# Patient Record
Sex: Male | Born: 1950 | Race: White | Hispanic: No | Marital: Married | Smoking: Never smoker
Health system: Southern US, Community
[De-identification: ages and names within clinical notes are randomized; demographics above are authoritative.]

## PROBLEM LIST (undated history)

## (undated) DIAGNOSIS — I10 Essential (primary) hypertension: Secondary | ICD-10-CM

## (undated) DIAGNOSIS — R51 Headache: Secondary | ICD-10-CM

## (undated) DIAGNOSIS — R609 Edema, unspecified: Secondary | ICD-10-CM

## (undated) DIAGNOSIS — G4733 Obstructive sleep apnea (adult) (pediatric): Secondary | ICD-10-CM

## (undated) DIAGNOSIS — N189 Chronic kidney disease, unspecified: Secondary | ICD-10-CM

## (undated) DIAGNOSIS — E7800 Pure hypercholesterolemia, unspecified: Secondary | ICD-10-CM

## (undated) DIAGNOSIS — M19011 Primary osteoarthritis, right shoulder: Secondary | ICD-10-CM

## (undated) DIAGNOSIS — R600 Localized edema: Secondary | ICD-10-CM

## (undated) DIAGNOSIS — M79609 Pain in unspecified limb: Secondary | ICD-10-CM

## (undated) DIAGNOSIS — R0683 Snoring: Secondary | ICD-10-CM

## (undated) DIAGNOSIS — T7840XA Allergy, unspecified, initial encounter: Secondary | ICD-10-CM

## (undated) DIAGNOSIS — L409 Psoriasis, unspecified: Secondary | ICD-10-CM

## (undated) DIAGNOSIS — G4700 Insomnia, unspecified: Secondary | ICD-10-CM

## (undated) DIAGNOSIS — M2550 Pain in unspecified joint: Secondary | ICD-10-CM

## (undated) DIAGNOSIS — M1990 Unspecified osteoarthritis, unspecified site: Secondary | ICD-10-CM

## (undated) DIAGNOSIS — K649 Unspecified hemorrhoids: Secondary | ICD-10-CM

## (undated) DIAGNOSIS — K589 Irritable bowel syndrome without diarrhea: Secondary | ICD-10-CM

## (undated) DIAGNOSIS — K5900 Constipation, unspecified: Secondary | ICD-10-CM

## (undated) DIAGNOSIS — J189 Pneumonia, unspecified organism: Secondary | ICD-10-CM

## (undated) DIAGNOSIS — Z8739 Personal history of other diseases of the musculoskeletal system and connective tissue: Secondary | ICD-10-CM

## (undated) DIAGNOSIS — E785 Hyperlipidemia, unspecified: Secondary | ICD-10-CM

## (undated) DIAGNOSIS — G4730 Sleep apnea, unspecified: Secondary | ICD-10-CM

## (undated) DIAGNOSIS — E119 Type 2 diabetes mellitus without complications: Secondary | ICD-10-CM

## (undated) DIAGNOSIS — K219 Gastro-esophageal reflux disease without esophagitis: Secondary | ICD-10-CM

## (undated) DIAGNOSIS — R0602 Shortness of breath: Secondary | ICD-10-CM

## (undated) HISTORY — DX: Morbid (severe) obesity due to excess calories: E66.01

## (undated) HISTORY — PX: ESOPHAGOGASTRODUODENOSCOPY: SHX1529

## (undated) HISTORY — PX: ANKLE SURGERY: SHX546

## (undated) HISTORY — DX: Sleep apnea, unspecified: G47.30

## (undated) HISTORY — DX: Snoring: R06.83

## (undated) HISTORY — PX: COLONOSCOPY: SHX174

## (undated) HISTORY — DX: Irritable bowel syndrome, unspecified: K58.9

## (undated) HISTORY — DX: Type 2 diabetes mellitus without complications: E11.9

## (undated) HISTORY — DX: Allergy, unspecified, initial encounter: T78.40XA

## (undated) HISTORY — DX: Chronic kidney disease, unspecified: N18.9

## (undated) HISTORY — PX: KNEE SURGERY: SHX244

## (undated) HISTORY — DX: Pure hypercholesterolemia, unspecified: E78.00

## (undated) HISTORY — DX: Obstructive sleep apnea (adult) (pediatric): G47.33

## (undated) HISTORY — DX: Pain in unspecified limb: M79.609

## (undated) HISTORY — DX: Essential (primary) hypertension: I10

---

## 2000-03-08 ENCOUNTER — Ambulatory Visit (HOSPITAL_BASED_OUTPATIENT_CLINIC_OR_DEPARTMENT_OTHER): Admission: RE | Admit: 2000-03-08 | Discharge: 2000-03-08 | Payer: Self-pay | Admitting: *Deleted

## 2000-03-30 ENCOUNTER — Encounter: Admission: RE | Admit: 2000-03-30 | Discharge: 2000-06-28 | Payer: Self-pay | Admitting: Internal Medicine

## 2000-07-07 ENCOUNTER — Encounter: Admission: RE | Admit: 2000-07-07 | Discharge: 2000-07-07 | Payer: Self-pay | Admitting: *Deleted

## 2005-11-18 ENCOUNTER — Ambulatory Visit: Payer: Self-pay | Admitting: Gastroenterology

## 2005-12-07 ENCOUNTER — Encounter: Payer: Self-pay | Admitting: Gastroenterology

## 2005-12-07 ENCOUNTER — Ambulatory Visit: Payer: Self-pay | Admitting: Gastroenterology

## 2008-07-05 ENCOUNTER — Encounter: Admission: RE | Admit: 2008-07-05 | Discharge: 2008-07-05 | Payer: Self-pay | Admitting: Sports Medicine

## 2010-08-28 NOTE — Consult Note (Signed)
[facility]  Patient:    Jamie Figueroa, Jamie Figueroa                       MRN: [id number] Proc. Date: 03/30/00 Adm. Date:  [id number] Attending:  Kandis Mannan                          Consultation Report  CHIEF COMPLAINT:  Painful area, plantar surface, right foot.  PRESENT ILLNESS:  This 60 year old white male, type 2 diabetic, has had pain on the ball of his right foot medially for about two months.  He does not remember stepping on anything and does not think that a foreign body is in his foot.  The area was probed with a needle by Dr. Tera Mater. [place] but no foreign body was noted.  He has not had any x-rays of the area.  Patient also has a slight callus of the right foot medially on the plantar surface.  MEDICATIONS  1. Hydrochlorothiazide.  2. Aspirin one a day.  3. Lasix 80 mg a day.  4. Altace 10 mg a day.  5. Tricor 200 mg a day.  6. Avandia 8 mg daily.  7. Celebrex 200 mg daily.  8. Vioxx 25 mg daily.  9. Diazepam 5 mg daily. 10. Ibuprofen 800 mg three times a day.  OPERATIONS:  Right knee surgery and also left knee surgery.  PHYSICAL EXAMINATION  EXTREMITIES:  Examination today reveals an obese white male who has a slightly firm area over the ball of his right foot on the plantar surface.  This was pared down and there appeared to be a 1-mm type plug which may represent a plantar wart.  There is certainly no foreign body seen or felt.  The callus of the medial aspect of the right foot was also pared.  DISPOSITION:  I do not think an x-ray is indicated because I think the risk of a foreign body is so low.  I think he does have a small plantar wart and he was advised to use a Band-Aid over the area.  I do not recommend excision.  We will see back in the clinic on a p.r.n. basis. DD:  03/30/00 TD:  03/31/00 Job: [id number]

## 2010-11-25 ENCOUNTER — Encounter: Payer: Self-pay | Admitting: Gastroenterology

## 2011-02-19 ENCOUNTER — Encounter: Payer: Self-pay | Admitting: Gastroenterology

## 2011-03-30 ENCOUNTER — Other Ambulatory Visit: Payer: Self-pay | Admitting: Gastroenterology

## 2012-01-10 ENCOUNTER — Encounter: Payer: Self-pay | Admitting: Gastroenterology

## 2012-07-03 ENCOUNTER — Ambulatory Visit (HOSPITAL_COMMUNITY): Payer: BC Managed Care – PPO | Attending: Cardiovascular Disease | Admitting: Radiology

## 2012-07-03 VITALS — BP 127/73 | Ht 70.0 in | Wt 286.0 lb

## 2012-07-03 DIAGNOSIS — R0602 Shortness of breath: Secondary | ICD-10-CM | POA: Insufficient documentation

## 2012-07-03 DIAGNOSIS — I4949 Other premature depolarization: Secondary | ICD-10-CM

## 2012-07-03 MED ORDER — REGADENOSON 0.4 MG/5ML IV SOLN
0.4000 mg | Freq: Once | INTRAVENOUS | Status: AC
  Administered 2012-07-03: 0.4 mg via INTRAVENOUS

## 2012-07-03 MED ORDER — TECHNETIUM TC 99M SESTAMIBI GENERIC - CARDIOLITE
30.0000 | Freq: Once | INTRAVENOUS | Status: AC | PRN
  Administered 2012-07-03: 30 via INTRAVENOUS

## 2012-07-03 NOTE — Progress Notes (Signed)
[facility] SITE 3 NUCLEAR MED [address] [place], [place] [address] [phone]    Cardiology Nuclear Med Study  Jamie Figueroa is a 62 y.o. male     MRN : [id number]     DOB: 1951/03/19  Procedure Date: 07/03/2012  Nuclear Med Background Indication for Stress Test:  Evaluation for Ischemia and Abnormal EKG History:  greater than 5 yrs MPS: per dictation @ [place] Cardio stress test No report Cardiac Risk Factors: Family History - CAD, Hypertension, IDDM Type 2 and Lipids  Symptoms:  Fatigue, Palpitations and SOB   Nuclear Pre-Procedure Caffeine/Decaff Intake:  None NPO After: 7:00pm   Lungs:  clear O2 Sat: 95% on room air. IV 0.9% NS with Angio Cath:  22g  IV Site: L Wrist  IV Started by:  Stanton Kidney, EMT-P  Chest Size (in):  50 Cup Size: n/a  Height: 5\' 10"  (1.778 m)  Weight:  286 lb (129.729 kg)  BMI:  Body mass index is 41.04 kg/(m^2). Tech Comments:  CBG= 115 @ 6:30am, per patient.    Nuclear Med Study 1 or 2 day study: 2 day  Stress Test Type:  Eugenie Birks  Reading MD: Charlton Haws, MD  Order Authorizing Provider:  Tis[place]c  Resting Radionuclide: Technetium 75m Sestamibi  Resting Radionuclide Dose: 33.0 mCi  07/04/12  Stress Radionuclide:  Technetium 8m Sestamibi  Stress Radionuclide Dose: 33.0 mCi  07/03/12          Stress Protocol Rest HR: 62 Stress HR: 88  Rest BP: 127/73 Stress BP: 160/70  Exercise Time (min): n/a METS: n/a   Predicted Max HR: 157 bpm % Max HR: 56.05 bpm Rate Pressure Product: 21308   Dose of Adenosine (mg):  n/a Dose of Lexiscan: 0.4 mg  Dose of Atropine (mg): n/a Dose of Dobutamine: n/a mcg/kg/min (at max HR)  Stress Test Technologist: Milana Na, EMT-P  Nuclear Technologist:  Doyne Keel, CNMT     Rest Procedure:  Myocardial perfusion imaging was performed at rest 45 minutes following the intravenous administration of Technetium 52m Sestamibi. Rest ECG: NSR with non-specific ST-T wave changes  Stress Procedure:  The  patient received IV Lexiscan 0.4 mg over 15-seconds.  Technetium 20m Sestamibi injected at 30-seconds. This patient was sob, had a cough and felt weird with the Lexiscan infusion. Quantitative spect images were obtained after a 45 minute delay. Stress ECG: No significant change from baseline ECG  QPS Raw Data Images:  Normal; no motion artifact; normal heart/lung ratio. Stress Images:  There is mild apical thinning with normal uptake in other regions. Rest Images:  There is mild apical thinning with normal uptake in other regions. Subtraction (SDS):  No evidence of ischemia. Transient Ischemic Dilatation (Normal <1.22):  0.98 Lung/Heart Ratio (Normal <0.45):  0.35  Quantitative Gated Spect Images QGS EDV:  84 ml QGS ESV:  32 ml  Impression Exercise Capacity:  Lexiscan with no exercise. BP Response:  Normal blood pressure response. Clinical Symptoms:  No significant symptoms noted. ECG Impression:  No significant ST segment change suggestive of ischemia. Comparison with Prior Nuclear Study: No images to compare  Overall Impression:  Low risk stress nuclear study.   There is no evidence of ischemia.  The apical thinning is likely due to artifact.   LV Ejection Fraction: 63%.  LV Wall Motion:  NL LV Function; NL Wall Motion.   [name], [name]., MD, [facility] 07/04/2012, 5:35 PM Office - [phone] Pager [phone]

## 2012-07-04 ENCOUNTER — Ambulatory Visit (HOSPITAL_COMMUNITY): Payer: BC Managed Care – PPO | Attending: Cardiovascular Disease | Admitting: Radiology

## 2012-07-04 DIAGNOSIS — R0989 Other specified symptoms and signs involving the circulatory and respiratory systems: Secondary | ICD-10-CM

## 2012-07-04 MED ORDER — TECHNETIUM TC 99M SESTAMIBI GENERIC - CARDIOLITE
33.0000 | Freq: Once | INTRAVENOUS | Status: AC | PRN
  Administered 2012-07-04: 33 via INTRAVENOUS

## 2012-07-05 NOTE — Addendum Note (Signed)
Addended by: Domenic Polite on: 07/05/2012 10:12 AM   Modules accepted: Orders

## 2012-11-24 ENCOUNTER — Ambulatory Visit
Admission: RE | Admit: 2012-11-24 | Discharge: 2012-11-24 | Disposition: A | Discharge status: Home / Self Care | Payer: BC Managed Care – PPO | Source: Ambulatory Visit | Attending: Orthopedic Surgery | Admitting: Orthopedic Surgery

## 2012-11-24 ENCOUNTER — Other Ambulatory Visit: Payer: Self-pay | Admitting: Orthopedic Surgery

## 2012-11-24 DIAGNOSIS — M25511 Pain in right shoulder: Secondary | ICD-10-CM

## 2012-11-27 ENCOUNTER — Other Ambulatory Visit: Payer: Self-pay | Admitting: Orthopedic Surgery

## 2012-11-27 ENCOUNTER — Ambulatory Visit
Admission: RE | Admit: 2012-11-27 | Discharge: 2012-11-27 | Disposition: A | Discharge status: Home / Self Care | Payer: BC Managed Care – PPO | Source: Ambulatory Visit | Attending: Orthopedic Surgery | Admitting: Orthopedic Surgery

## 2012-11-27 DIAGNOSIS — M25511 Pain in right shoulder: Secondary | ICD-10-CM

## 2012-12-07 ENCOUNTER — Encounter: Payer: Self-pay | Admitting: Neurology

## 2012-12-07 ENCOUNTER — Ambulatory Visit (INDEPENDENT_AMBULATORY_CARE_PROVIDER_SITE_OTHER): Payer: BC Managed Care – PPO | Admitting: Neurology

## 2012-12-07 VITALS — BP 143/76 | HR 68 | Temp 98.3°F | Ht 70.0 in | Wt 286.0 lb

## 2012-12-07 DIAGNOSIS — R400 Somnolence: Secondary | ICD-10-CM

## 2012-12-07 DIAGNOSIS — M19019 Primary osteoarthritis, unspecified shoulder: Secondary | ICD-10-CM

## 2012-12-07 DIAGNOSIS — R0683 Snoring: Secondary | ICD-10-CM

## 2012-12-07 DIAGNOSIS — G4710 Hypersomnia, unspecified: Secondary | ICD-10-CM

## 2012-12-07 DIAGNOSIS — R0609 Other forms of dyspnea: Secondary | ICD-10-CM

## 2012-12-07 DIAGNOSIS — G4733 Obstructive sleep apnea (adult) (pediatric): Secondary | ICD-10-CM

## 2012-12-07 DIAGNOSIS — R413 Other amnesia: Secondary | ICD-10-CM

## 2012-12-07 HISTORY — DX: Snoring: R06.83

## 2012-12-07 NOTE — Patient Instructions (Addendum)
Based on your symptoms and your exam I believe you are at risk for obstructive sleep apnea or OSA, and I think we should proceed with a sleep study to determine whether you do or do not have OSA and how severe it is. If you have more than mild OSA, I want you to consider treatment with CPAP. Please remember, the risks and ramifications of moderate to severe obstructive sleep apnea or OSA are: Cardiovascular disease, including congestive heart failure, stroke, difficult to control hypertension, arrhythmias, and even type 2 diabetes has been linked to untreated OSA. Sleep apnea causes disruption of sleep and sleep deprivation in most cases, which, in turn, can cause recurrent headaches, problems with memory, mood, concentration, focus, and vigilance. Most people with untreated sleep apnea report excessive daytime sleepiness, which can affect their ability to drive. Please do not drive if you feel sleepy.  I will see you back after your sleep study to go over the test results and where to go from there. We will call you after your sleep study and to set up an appointment at the time.   Please remember to try to maintain good sleep hygiene, which means: Keep a regular sleep and wake schedule, try not to exercise or have a meal within 2 hours of your bedtime, try to keep your bedroom conducive for sleep, that is, cool and dark, without light distractors such as an illuminated alarm clock, and refrain from watching TV right before sleep or in the middle of the night and do not keep the TV or radio on during the night. Also, try not to use or play on electronic devices at bedtime, such as your cell phone, tablet PC or laptop. If you like to read at bedtime on an electronic device, try to dim the background light as much as possible.  

## 2012-12-07 NOTE — Progress Notes (Signed)
Subjective:    Patient ID: Jamie Figueroa is a 62 y.o. male.  HPI  Huston Foley, MD, PhD [facility] [address] [address] [place], [place] [address]  Dear Dr. Wylene Simmer,   I saw your patient, Jamie Figueroa, upon your kind request in my neurologic clinic today for initial consultation of his sleep disorder, concern for obstructive sleep apnea. The patient is accompanied by his wife today. As you know, Jamie Figueroa is a very pleasant 62 year old right-handed gentleman with an underlying medical history of hypertension, diabetes, hyperlipidemia, irritable bowel syndrome, chronic kidney disease and obesity, who reports snoring, excessive daytime somnolence, nonrestorative sleep, and disrupted sleep.   His typical bedtime is reported to be around 8:30 to 10 PM and usual wake time is around 5:30-6 AM. Sleep onset typically occurs within a few minutes. He reports feeling marginally rested upon awakening. He wakes up on an average 3 to 4 times in the middle of the night and has to go to the bathroom 1 time on a typical night. He admits to rare morning headaches.  He reports excessive daytime somnolence (EDS) and His Epworth Sleepiness Score (ESS) is 12/24 today. He has fallen asleep while driving as in close to dozing off. The patient has not been taking a planned nap, but falls asleep sometimes without planning to.   He has been known to snore for the past many years. Snoring is reportedly moderate, and associated with choking sounds and witnessed apneas. The patient denies a sense of choking or strangling feeling. There is report of rare nighttime reflux, with rare nighttime cough experienced. The patient has not noted any RLS symptoms and is not known to kick while asleep or before falling asleep. There is no family history of RLS or OSA, but his father snored.  He is a restless sleeper and in the morning, the bed is quite disheveled.   He denies cataplexy, hypnagogic or  hypnopompic hallucinations, or sleep attacks, except for rare, remote sleep paralysis. He does not report any vivid dreams, nightmares, dream enactments, or parasomnias, such as sleep talking or sleep walking. The patient has not had a sleep study or a home sleep test.  He consumes 3 caffeinated beverages per day, usually in the form of sodas, and tea.  His bedroom is usually dark and cool. There is a TV in the bedroom and usually it is not on at night or turned off right before sleep onset.  He reports some memory issues in the past year, mostly forgetfulness and word finding difficulties, misplacing things and losing train of thought.   His Past Medical History Is Significant For: Past Medical History  Diagnosis Date  . HTN (hypertension)   . DM (diabetes mellitus)   . Hypercholesteremia   . Morbid obesity   . Irritable bowel syndrome   . Chronic kidney disease, stage III (moderate)   . Pain in limb   . Obstructive sleep apnea (adult) (pediatric)     His Past Surgical History Is Significant For: Past Surgical History  Procedure Laterality Date  . Knee surgery    . Ankle surgery      His Family History Is Significant For: Family History  Problem Relation Age of Onset  . Diabetes Mother     His Social History Is Significant For: History   Social History  . Marital Status: Married    Spouse Name: N/A    Number of Children: N/A  . Years of  Education: N/A   Social History Main Topics  . Smoking status: Never Smoker   . Smokeless tobacco: None  . Alcohol Use: 0.5 oz/week    1 drink(s) per week  . Drug Use: No  . Sexual Activity: None   Other Topics Concern  . None   Social History Narrative  . None    His Allergies Are:  No Known Allergies:   His Current Medications Are:  Outpatient Encounter Prescriptions as of 12/07/2012  Medication Sig Dispense Refill  . aspirin 81 MG tablet Take 81 mg by mouth daily.      . Choline Fenofibrate (FENOFIBRIC ACID) 135 MG CPDR  Take 1 tablet by mouth daily.       . furosemide (LASIX) 40 MG tablet Take 40 mg by mouth daily.       . Glucosamine Sulfate 1000 MG TABS Take 1 tablet by mouth daily.      Marland Kitchen LEVEMIR FLEXTOUCH 100 UNIT/ML SOPN       . NOVOLOG FLEXPEN 100 UNIT/ML SOPN FlexPen       . NOVOTWIST 32G X 5 MM MISC Pt uses 5 needles daily      . ONE TOUCH ULTRA TEST test strip       . POTASSIUM CHLORIDE PO Take 1 tablet by mouth daily.      . ramipril (ALTACE) 10 MG capsule Take 10 mg by mouth daily.       . sildenafil (VIAGRA) 100 MG tablet Take 100 mg by mouth daily as needed for erectile dysfunction.      Marland Kitchen VICTOZA 18 MG/3ML SOPN Place 1.8 mg under the tongue daily.       Marland Kitchen VYTORIN 10-40 MG per tablet Take 1 tablet by mouth daily.        No facility-administered encounter medications on file as of 12/07/2012.   Review of Systems  Constitutional: Positive for fatigue.  Respiratory: Positive for shortness of breath.        Snoring  Gastrointestinal: Positive for constipation.  Genitourinary:       Impotence    Musculoskeletal: Positive for myalgias and arthralgias.       Cramps  Neurological:       Memory loss  Hematological: Bruises/bleeds easily.  Psychiatric/Behavioral: Positive for confusion.    Objective:  Neurologic Exam  Physical Exam Physical Examination:   Filed Vitals:   12/07/12 0855  BP: 143/76  Pulse: 68  Temp: 98.3 F (36.8 C)    General Examination: The patient is a very pleasant 62 y.o. male in no acute distress. He appears well-developed and well-nourished and well groomed. He is obese.  HEENT: Normocephalic, atraumatic, pupils are equal, round and reactive to light and accommodation. Funduscopic exam is normal with sharp disc margins noted. Extraocular tracking is good without limitation to gaze excursion or nystagmus noted. Normal smooth pursuit is noted. Hearing is grossly intact. Tympanic membranes are clear bilaterally. Face is symmetric with normal facial animation and  normal facial sensation. Speech is clear with no dysarthria noted. There is no hypophonia. There is no lip, neck/head, jaw or voice tremor. Neck is supple with full range of passive and active motion. There are no carotid bruits on auscultation. Oropharynx exam reveals: mild mouth dryness, adequate dental hygiene and moderate airway crowding, due to elongated uvula, redundant soft palate, larger tongue and tonsillar size of 1+. Mallampati is class II. Tongue protrudes centrally and palate elevates symmetrically. Neck size is 20 inches.   Chest: Clear to auscultation without  wheezing, rhonchi or crackles noted.  Heart: S1+S2+0, regular and normal without murmurs, rubs or gallops noted.   Abdomen: Soft, non-tender and non-distended with normal bowel sounds appreciated on auscultation.  Extremities: There is no pitting edema in the distal lower extremities bilaterally. Pedal pulses are intact. He has limitation in R shoulder ROM and pain.  Skin: Warm and dry without trophic changes noted. There are no varicose veins.  Musculoskeletal: exam reveals no obvious joint deformities, tenderness or joint swelling or erythema.   Neurologically:  Mental status: The patient is awake, alert and oriented in all 4 spheres. His memory, attention, language and knowledge are appropriate. There is no aphasia, agnosia, apraxia or anomia. Speech is clear with normal prosody and enunciation. Thought process is linear. Mood is congruent and affect is normal.  Cranial nerves are as described above under HEENT exam. In addition, shoulder shrug is normal with equal shoulder height noted. Motor exam: Normal bulk, strength and tone is noted with the exception of R shoulder pain limitation. He has pain in both knees as well. There is no drift, tremor or rebound. Romberg is negative. Reflexes are 2+ throughout. Toes are downgoing bilaterally. Fine motor skills are intact with normal finger taps, normal hand movements, normal rapid  alternating patting, normal foot taps and normal foot agility.  Cerebellar testing shows no dysmetria or intention tremor on finger to nose testing. Heel to shin is unremarkable bilaterally. There is no truncal or gait ataxia.  Sensory exam is intact to light touch, pinprick, vibration, temperature sense and proprioception in the upper and lower extremities.  Gait, station and balance are unremarkable. No veering to one side is noted. No leaning to one side is noted. Posture is age-appropriate and stance is narrow based. No problems turning are noted. He turns en bloc. Tandem walk is unremarkable. Intact toe and heel stance is noted.               Assessment and Plan:  In summary, Jamie Figueroa is a very pleasant 62 y.o.-year old male with a history and physical exam concerning for obstructive sleep apnea (OSA). I had a long chat with the patient and his wife about my findings and the diagnosis, its prognosis and treatment options. We talked about medical treatments and non-pharmacological approaches. I explained in particular the risks and ramifications of untreated moderate to severe OSA, especially with respect to developing cardiovascular disease down the Road, including congestive heart failure, difficult to treat hypertension, cardiac arrhythmias, or stroke. Even type 2 diabetes has in part been linked to untreated OSA. We talked about trying to maintain a healthy lifestyle in general, as well as the importance of weight control. I encouraged the patient to eat healthy, exercise daily and keep well hydrated, to keep a scheduled bedtime and wake time routine, to not skip any meals and eat healthy snacks in between meals.  I recommended the following at this time: sleep study with potential CPAP titration.  I explained the sleep test procedure to the patient and also outlined surgical and non-surgical treatment options of OSA including the use of a dental custom-made appliance, upper airway surgery  such as pillar implants, radiofrequency surgery, tongue base surgery, and UPPP. I also explained the CPAP treatment option to the patient, who indicated that he would be willing to try CPAP if the need arises. I explained the importance of being compliant with PAP treatment, not only for insurance purposes but primarily for the patient's long term health benefit.  I answered all their questions today and the patient and his wife were in agreement. I would like to see him back after the sleep study is completed and encouraged them to call with any interim questions, concerns, problems or updates.   Thank you very much for allowing me to participate in the care of this nice patient. If I can be of any further assistance to you please do not hesitate to call me at [phone].  Sincerely,   Huston Foley, MD, PhD

## 2012-12-25 ENCOUNTER — Ambulatory Visit (INDEPENDENT_AMBULATORY_CARE_PROVIDER_SITE_OTHER): Payer: BC Managed Care – PPO | Admitting: Neurology

## 2012-12-25 DIAGNOSIS — G4710 Hypersomnia, unspecified: Secondary | ICD-10-CM

## 2012-12-25 DIAGNOSIS — G4761 Periodic limb movement disorder: Secondary | ICD-10-CM

## 2012-12-25 DIAGNOSIS — G4733 Obstructive sleep apnea (adult) (pediatric): Secondary | ICD-10-CM

## 2012-12-25 DIAGNOSIS — R400 Somnolence: Secondary | ICD-10-CM

## 2013-01-05 ENCOUNTER — Telehealth: Payer: Self-pay | Admitting: Neurology

## 2013-01-05 DIAGNOSIS — G4733 Obstructive sleep apnea (adult) (pediatric): Secondary | ICD-10-CM

## 2013-01-05 DIAGNOSIS — R9431 Abnormal electrocardiogram [ECG] [EKG]: Secondary | ICD-10-CM

## 2013-01-05 NOTE — Telephone Encounter (Signed)
In addition to first message, pls inquire, if he has a cardiologist. He had frequent "extra beats" on EKG during sleep study.

## 2013-01-05 NOTE — Telephone Encounter (Signed)
Please call and notify patient that the recent sleep study confirmed the diagnosis of OSA. He did reasonably well with CPAP during the study with significant improvement of the respiratory events. He was noted to breathe through his mouth and did not do well with a FFM, but I will prescribe a chinstrap to use with the nasal pillows mask.  I would like start the patient on CPAP at home. I placed the order in the chart.   Arrange for CPAP set up at home through a DME company of patient's choice and fax/route report to PCP and referring MD (if other than PCP).   The patient will also need a follow up appointment with me in 6-8 weeks post set up that has to be scheduled; help the patient schedule this (in a follow-up slot).   Please re-enforce the importance of compliance with treatment and the need for [place] to monitor compliance data.   Once you have spoken to the patient and scheduled the return appointment, you may close this encounter, thanks,   Huston Foley, MD, PhD [facility] ([facility])

## 2013-01-07 NOTE — Telephone Encounter (Signed)
Called patient to discuss sleep study results.  Discussed findings, recommendations and follow up care.  Patient understood well and all questions were answered.   Orders will be sent to [facility] due to patient being almost eligible for Medicare... This way he won't have to switch providers later on.   He explains he doesn't have a cardiologist but he did have a stress test done a few months back which checked out normal.  This was done at [facility] and they didn't even ask him to follow up.  I explained I would relay this info to Dr. Frances Furbish and that she may/may not refer him back to cardiology due to these extra beats on his EKG just to make sure all is well.  He understood. A copy of the sleep study interpretive report as well as a letter with info regarding contact info for the DME company, the importance of CPAP compliance, and the date of the follow up appointment info will be mailed to the patient's home.  The patient chooses to wait until he gets his equipment and then he will call in to schedule a follow up appointment.  He has some things going on in November and this will allow him to check his calendar prior to scheduling. -sh

## 2013-01-08 ENCOUNTER — Encounter: Payer: Self-pay | Admitting: *Deleted

## 2013-02-05 ENCOUNTER — Encounter (HOSPITAL_COMMUNITY)
Admission: RE | Admit: 2013-02-05 | Discharge: 2013-02-05 | Disposition: A | Discharge status: Home / Self Care | Payer: BC Managed Care – PPO | Source: Ambulatory Visit | Attending: Anesthesiology | Admitting: Anesthesiology

## 2013-02-05 ENCOUNTER — Other Ambulatory Visit: Payer: Self-pay | Admitting: Orthopedic Surgery

## 2013-02-05 ENCOUNTER — Encounter (HOSPITAL_COMMUNITY)
Admission: RE | Admit: 2013-02-05 | Discharge: 2013-02-05 | Disposition: A | Discharge status: Home / Self Care | Payer: BC Managed Care – PPO | Source: Ambulatory Visit | Attending: Orthopedic Surgery | Admitting: Orthopedic Surgery

## 2013-02-05 ENCOUNTER — Encounter (HOSPITAL_COMMUNITY): Payer: Self-pay

## 2013-02-05 DIAGNOSIS — Z01812 Encounter for preprocedural laboratory examination: Secondary | ICD-10-CM | POA: Insufficient documentation

## 2013-02-05 DIAGNOSIS — Z01810 Encounter for preprocedural cardiovascular examination: Secondary | ICD-10-CM | POA: Insufficient documentation

## 2013-02-05 DIAGNOSIS — Z01818 Encounter for other preprocedural examination: Secondary | ICD-10-CM | POA: Insufficient documentation

## 2013-02-05 HISTORY — DX: Headache: R51

## 2013-02-05 HISTORY — DX: Pain in unspecified joint: M25.50

## 2013-02-05 HISTORY — DX: Unspecified osteoarthritis, unspecified site: M19.90

## 2013-02-05 HISTORY — DX: Unspecified hemorrhoids: K64.9

## 2013-02-05 HISTORY — DX: Localized edema: R60.0

## 2013-02-05 HISTORY — DX: Shortness of breath: R06.02

## 2013-02-05 HISTORY — DX: Psoriasis, unspecified: L40.9

## 2013-02-05 HISTORY — DX: Edema, unspecified: R60.9

## 2013-02-05 HISTORY — DX: Personal history of other diseases of the musculoskeletal system and connective tissue: Z87.39

## 2013-02-05 HISTORY — DX: Irritable bowel syndrome, unspecified: K58.9

## 2013-02-05 HISTORY — DX: Gastro-esophageal reflux disease without esophagitis: K21.9

## 2013-02-05 HISTORY — DX: Constipation, unspecified: K59.00

## 2013-02-05 HISTORY — DX: Pneumonia, unspecified organism: J18.9

## 2013-02-05 HISTORY — DX: Hyperlipidemia, unspecified: E78.5

## 2013-02-05 HISTORY — DX: Insomnia, unspecified: G47.00

## 2013-02-05 LAB — TYPE AND SCREEN
ABO/RH(D): O POS
Antibody Screen: NEGATIVE

## 2013-02-05 LAB — ABO/RH: ABO/RH(D): O POS

## 2013-02-05 LAB — CBC
Hemoglobin: 15.3 g/dL (ref 13.0–17.0)
MCV: 97.2 fL (ref 78.0–100.0)
Platelets: 208 10*3/uL (ref 150–400)
RBC: 4.67 MIL/uL (ref 4.22–5.81)
RDW: 13 % (ref 11.5–15.5)
WBC: 8.7 10*3/uL (ref 4.0–10.5)

## 2013-02-05 LAB — BASIC METABOLIC PANEL
CO2: 26 mEq/L (ref 19–32)
Calcium: 8.9 mg/dL (ref 8.4–10.5)
Creatinine, Ser: 1.17 mg/dL (ref 0.50–1.35)
GFR calc non Af Amer: 65 mL/min — ABNORMAL LOW (ref >90)
Potassium: 3.9 mEq/L (ref 3.5–5.1)
Sodium: 139 mEq/L (ref 135–145)

## 2013-02-05 NOTE — Progress Notes (Signed)
Pt doesn't have a cardiologist  Stress test report in epic from 2014  Denies ever having an echo or heart cath  Medical Md with [facility] Dr.Tisavec  Denies EKG or CXR in epic   Sleep study report in epic from 2014

## 2013-02-05 NOTE — Pre-Procedure Instructions (Signed)
Ivo Moga [place]  02/05/2013   Your procedure is scheduled on:  Tues, Nov 4 @ 7:30 AM  Report to [facility] Short Stay [facility]trance A at 5:30 AM.  Call this number if you have problems the morning of surgery: [phone]   Remember:   Do not eat food or drink liquids after midnight.                No Goody's,BC's,Ibuprofen,Aspirin,Fish Oil,or any Herbal Medications   Do not wear jewelry  Do not wear lotions, powders, or colognes. You may wear deodorant.  Men may shave face and neck.  Do not bring valuables to the hospital.  [facility] is not responsible                  for any belongings or valuables.               Contacts, dentures or bridgework may not be worn into surgery.  Leave suitcase in the car. After surgery it may be brought to your room.  For patients admitted to the hospital, discharge time is determined by your                treatment team.               Patients discharged the day of surgery will not be allowed to drive  home.    Special Instructions: Shower using CHG 2 nights before surgery and the night before surgery.  If you shower the day of surgery use CHG.  Use special wash - you have one bottle of CHG for all showers.  You should use approximately 1/3 of the bottle for each shower.   Please read over the following fact sheets that you were given: Pain Booklet, Coughing and Deep Breathing, Blood Transfusion Information, MRSA Information and Surgical Site Infection Prevention

## 2013-02-12 MED ORDER — DEXTROSE 5 % IV SOLN
3.0000 g | INTRAVENOUS | Status: AC
  Administered 2013-02-13: 3 g via INTRAVENOUS
  Filled 2013-02-12: qty 3000

## 2013-02-13 ENCOUNTER — Inpatient Hospital Stay (HOSPITAL_COMMUNITY): Payer: BC Managed Care – PPO

## 2013-02-13 ENCOUNTER — Encounter (HOSPITAL_COMMUNITY): Payer: Self-pay | Admitting: *Deleted

## 2013-02-13 ENCOUNTER — Ambulatory Visit (HOSPITAL_COMMUNITY): Payer: BC Managed Care – PPO | Admitting: Critical Care Medicine

## 2013-02-13 ENCOUNTER — Inpatient Hospital Stay (HOSPITAL_COMMUNITY)
Admission: RE | Admit: 2013-02-13 | Discharge: 2013-02-14 | DRG: 483 | Disposition: A | Discharge status: Home / Self Care | Payer: BC Managed Care – PPO | Source: Ambulatory Visit | Attending: Orthopedic Surgery | Admitting: Orthopedic Surgery

## 2013-02-13 ENCOUNTER — Encounter (HOSPITAL_COMMUNITY): Payer: BC Managed Care – PPO | Admitting: Critical Care Medicine

## 2013-02-13 ENCOUNTER — Encounter (HOSPITAL_COMMUNITY)
Admission: RE | Disposition: A | Discharge status: Home / Self Care | Payer: Self-pay | Source: Ambulatory Visit | Attending: Orthopedic Surgery

## 2013-02-13 DIAGNOSIS — Z7982 Long term (current) use of aspirin: Secondary | ICD-10-CM

## 2013-02-13 DIAGNOSIS — M19011 Primary osteoarthritis, right shoulder: Secondary | ICD-10-CM | POA: Diagnosis present

## 2013-02-13 DIAGNOSIS — E119 Type 2 diabetes mellitus without complications: Secondary | ICD-10-CM | POA: Diagnosis present

## 2013-02-13 DIAGNOSIS — Z794 Long term (current) use of insulin: Secondary | ICD-10-CM

## 2013-02-13 DIAGNOSIS — K589 Irritable bowel syndrome without diarrhea: Secondary | ICD-10-CM | POA: Diagnosis present

## 2013-02-13 DIAGNOSIS — L408 Other psoriasis: Secondary | ICD-10-CM | POA: Diagnosis present

## 2013-02-13 DIAGNOSIS — F329 Major depressive disorder, single episode, unspecified: Secondary | ICD-10-CM | POA: Diagnosis present

## 2013-02-13 DIAGNOSIS — Z833 Family history of diabetes mellitus: Secondary | ICD-10-CM

## 2013-02-13 DIAGNOSIS — E785 Hyperlipidemia, unspecified: Secondary | ICD-10-CM | POA: Diagnosis present

## 2013-02-13 DIAGNOSIS — Z6841 Body Mass Index (BMI) 40.0 and over, adult: Secondary | ICD-10-CM

## 2013-02-13 DIAGNOSIS — G4700 Insomnia, unspecified: Secondary | ICD-10-CM | POA: Diagnosis present

## 2013-02-13 DIAGNOSIS — E7800 Pure hypercholesterolemia, unspecified: Secondary | ICD-10-CM | POA: Diagnosis present

## 2013-02-13 DIAGNOSIS — M19019 Primary osteoarthritis, unspecified shoulder: Principal | ICD-10-CM | POA: Diagnosis present

## 2013-02-13 DIAGNOSIS — Z79899 Other long term (current) drug therapy: Secondary | ICD-10-CM

## 2013-02-13 DIAGNOSIS — F3289 Other specified depressive episodes: Secondary | ICD-10-CM | POA: Diagnosis present

## 2013-02-13 DIAGNOSIS — I10 Essential (primary) hypertension: Secondary | ICD-10-CM | POA: Diagnosis present

## 2013-02-13 DIAGNOSIS — M109 Gout, unspecified: Secondary | ICD-10-CM | POA: Diagnosis present

## 2013-02-13 DIAGNOSIS — K219 Gastro-esophageal reflux disease without esophagitis: Secondary | ICD-10-CM | POA: Diagnosis present

## 2013-02-13 DIAGNOSIS — G4733 Obstructive sleep apnea (adult) (pediatric): Secondary | ICD-10-CM | POA: Diagnosis present

## 2013-02-13 HISTORY — PX: TOTAL SHOULDER ARTHROPLASTY: SHX126

## 2013-02-13 HISTORY — DX: Primary osteoarthritis, right shoulder: M19.011

## 2013-02-13 HISTORY — PX: TOTAL SHOULDER REPLACEMENT: SUR1217

## 2013-02-13 LAB — GLUCOSE, CAPILLARY
Glucose-Capillary: 165 mg/dL — ABNORMAL HIGH (ref 70–99)
Glucose-Capillary: 186 mg/dL — ABNORMAL HIGH (ref 70–99)
Glucose-Capillary: 292 mg/dL — ABNORMAL HIGH (ref 70–99)

## 2013-02-13 LAB — CBC
MCH: 32.4 pg (ref 26.0–34.0)
MCHC: 34.1 g/dL (ref 30.0–36.0)
MCV: 94.9 fL (ref 78.0–100.0)
Platelets: 189 10*3/uL (ref 150–400)
RBC: 4.48 MIL/uL (ref 4.22–5.81)
RDW: 12.8 % (ref 11.5–15.5)

## 2013-02-13 SURGERY — ARTHROPLASTY, SHOULDER, TOTAL
Anesthesia: General | Site: Shoulder | Laterality: Right | Wound class: Clean

## 2013-02-13 MED ORDER — METOCLOPRAMIDE HCL 5 MG PO TABS
5.0000 mg | ORAL_TABLET | Freq: Three times a day (TID) | ORAL | Status: DC | PRN
  Filled 2013-02-13: qty 2

## 2013-02-13 MED ORDER — FENOFIBRIC ACID 135 MG PO CPDR: 1.0000 | DELAYED_RELEASE_CAPSULE | Freq: Every day | ORAL | Status: DC

## 2013-02-13 MED ORDER — EZETIMIBE-SIMVASTATIN 10-40 MG PO TABS
1.0000 | ORAL_TABLET | Freq: Every day | ORAL | Status: DC
  Administered 2013-02-13 – 2013-02-14 (×2): 1 via ORAL
  Filled 2013-02-13 (×2): qty 1

## 2013-02-13 MED ORDER — ENOXAPARIN SODIUM 40 MG/0.4ML ~~LOC~~ SOLN
40.0000 mg | Freq: Every day | SUBCUTANEOUS | Status: DC
  Administered 2013-02-13: 40 mg via SUBCUTANEOUS
  Filled 2013-02-13 (×2): qty 0.4

## 2013-02-13 MED ORDER — METOCLOPRAMIDE HCL 5 MG/ML IJ SOLN: 10.0000 mg | Freq: Once | INTRAMUSCULAR | Status: DC | PRN

## 2013-02-13 MED ORDER — INSULIN DETEMIR 100 UNIT/ML ~~LOC~~ SOLN
56.0000 [IU] | Freq: Two times a day (BID) | SUBCUTANEOUS | Status: DC
  Administered 2013-02-13 – 2013-02-14 (×2): 56 [IU] via SUBCUTANEOUS
  Filled 2013-02-13 (×3): qty 0.56

## 2013-02-13 MED ORDER — MIDAZOLAM HCL 5 MG/5ML IJ SOLN
INTRAMUSCULAR | Status: DC | PRN
  Administered 2013-02-13: 2 mg via INTRAVENOUS

## 2013-02-13 MED ORDER — ONDANSETRON HCL 4 MG/2ML IJ SOLN: 4.0000 mg | Freq: Four times a day (QID) | INTRAMUSCULAR | Status: DC | PRN

## 2013-02-13 MED ORDER — MENTHOL 3 MG MT LOZG: 1.0000 | LOZENGE | OROMUCOSAL | Status: DC | PRN

## 2013-02-13 MED ORDER — DOCUSATE SODIUM 100 MG PO CAPS
100.0000 mg | ORAL_CAPSULE | Freq: Two times a day (BID) | ORAL | Status: DC
  Administered 2013-02-13 – 2013-02-14 (×2): 100 mg via ORAL
  Filled 2013-02-13 (×3): qty 1

## 2013-02-13 MED ORDER — DIPHENHYDRAMINE HCL 12.5 MG/5ML PO ELIX: 12.5000 mg | ORAL_SOLUTION | ORAL | Status: DC | PRN

## 2013-02-13 MED ORDER — POTASSIUM CHLORIDE IN NACL 20-0.45 MEQ/L-% IV SOLN
INTRAVENOUS | Status: DC
  Administered 2013-02-13: 17:00:00 via INTRAVENOUS
  Filled 2013-02-13 (×4): qty 1000

## 2013-02-13 MED ORDER — METHOCARBAMOL 500 MG PO TABS: 500.0000 mg | ORAL_TABLET | Freq: Four times a day (QID) | ORAL | Status: DC

## 2013-02-13 MED ORDER — POTASSIUM GLUCONATE 2 MEQ PO TABS: 1.0000 | ORAL_TABLET | Freq: Every morning | ORAL | Status: DC

## 2013-02-13 MED ORDER — PROPOFOL 10 MG/ML IV BOLUS
INTRAVENOUS | Status: DC | PRN
  Administered 2013-02-13: 200 mg via INTRAVENOUS

## 2013-02-13 MED ORDER — CEFAZOLIN SODIUM 1-5 GM-% IV SOLN
1.0000 g | Freq: Four times a day (QID) | INTRAVENOUS | Status: AC
  Administered 2013-02-13 – 2013-02-14 (×3): 1 g via INTRAVENOUS
  Filled 2013-02-13 (×3): qty 50

## 2013-02-13 MED ORDER — POLYETHYLENE GLYCOL 3350 17 G PO PACK: 17.0000 g | PACK | Freq: Every day | ORAL | Status: DC | PRN

## 2013-02-13 MED ORDER — ONDANSETRON HCL 4 MG PO TABS: 4.0000 mg | ORAL_TABLET | Freq: Four times a day (QID) | ORAL | Status: DC | PRN

## 2013-02-13 MED ORDER — ARTIFICIAL TEARS OP OINT
TOPICAL_OINTMENT | OPHTHALMIC | Status: DC | PRN
  Administered 2013-02-13: 1 via OPHTHALMIC

## 2013-02-13 MED ORDER — OXYCODONE HCL 5 MG/5ML PO SOLN: 5.0000 mg | Freq: Once | ORAL | Status: DC | PRN

## 2013-02-13 MED ORDER — ACETAMINOPHEN 325 MG PO TABS: 650.0000 mg | ORAL_TABLET | Freq: Four times a day (QID) | ORAL | Status: DC | PRN

## 2013-02-13 MED ORDER — OXYCODONE HCL 5 MG PO TABS: 5.0000 mg | ORAL_TABLET | ORAL | Status: DC | PRN

## 2013-02-13 MED ORDER — ZOLPIDEM TARTRATE 5 MG PO TABS: 5.0000 mg | ORAL_TABLET | Freq: Every evening | ORAL | Status: DC | PRN

## 2013-02-13 MED ORDER — ONDANSETRON HCL 4 MG/2ML IJ SOLN
INTRAMUSCULAR | Status: DC | PRN
  Administered 2013-02-13: 4 mg via INTRAVENOUS

## 2013-02-13 MED ORDER — DEXAMETHASONE SODIUM PHOSPHATE 4 MG/ML IJ SOLN
INTRAMUSCULAR | Status: DC | PRN
  Administered 2013-02-13: 4 mg via INTRAVENOUS

## 2013-02-13 MED ORDER — LACTATED RINGERS IV SOLN
INTRAVENOUS | Status: DC | PRN
  Administered 2013-02-13 (×2): via INTRAVENOUS

## 2013-02-13 MED ORDER — ALUM & MAG HYDROXIDE-SIMETH 200-200-20 MG/5ML PO SUSP
30.0000 mL | ORAL | Status: DC | PRN
  Administered 2013-02-13: 30 mL via ORAL
  Filled 2013-02-13: qty 30

## 2013-02-13 MED ORDER — SENNA 8.6 MG PO TABS
1.0000 | ORAL_TABLET | Freq: Two times a day (BID) | ORAL | Status: DC
Start: 2013-02-13 — End: 2013-02-14
  Administered 2013-02-13 – 2013-02-14 (×2): 8.6 mg via ORAL
  Filled 2013-02-13 (×5): qty 1

## 2013-02-13 MED ORDER — FUROSEMIDE 40 MG PO TABS
40.0000 mg | ORAL_TABLET | Freq: Every day | ORAL | Status: DC
  Administered 2013-02-13 – 2013-02-14 (×2): 40 mg via ORAL
  Filled 2013-02-13 (×2): qty 1

## 2013-02-13 MED ORDER — METOCLOPRAMIDE HCL 5 MG/ML IJ SOLN: 5.0000 mg | Freq: Three times a day (TID) | INTRAMUSCULAR | Status: DC | PRN

## 2013-02-13 MED ORDER — METHOCARBAMOL 100 MG/ML IJ SOLN
500.0000 mg | Freq: Four times a day (QID) | INTRAVENOUS | Status: DC | PRN
  Filled 2013-02-13: qty 5

## 2013-02-13 MED ORDER — NEOSTIGMINE METHYLSULFATE 1 MG/ML IJ SOLN
INTRAMUSCULAR | Status: DC | PRN
  Administered 2013-02-13: 5 mg via INTRAVENOUS

## 2013-02-13 MED ORDER — HYDROMORPHONE HCL PF 1 MG/ML IJ SOLN: 0.2500 mg | INTRAMUSCULAR | Status: DC | PRN

## 2013-02-13 MED ORDER — HYDROMORPHONE HCL PF 1 MG/ML IJ SOLN: 0.5000 mg | INTRAMUSCULAR | Status: DC | PRN

## 2013-02-13 MED ORDER — FENOFIBRATE 160 MG PO TABS
160.0000 mg | ORAL_TABLET | Freq: Every day | ORAL | Status: DC
  Administered 2013-02-13 – 2013-02-14 (×2): 160 mg via ORAL
  Filled 2013-02-13 (×2): qty 1

## 2013-02-13 MED ORDER — EPHEDRINE SULFATE 50 MG/ML IJ SOLN
INTRAMUSCULAR | Status: DC | PRN
  Administered 2013-02-13 (×3): 5 mg via INTRAVENOUS

## 2013-02-13 MED ORDER — ACETAMINOPHEN 650 MG RE SUPP: 650.0000 mg | Freq: Four times a day (QID) | RECTAL | Status: DC | PRN

## 2013-02-13 MED ORDER — OXYCODONE-ACETAMINOPHEN 5-325 MG PO TABS
1.0000 | ORAL_TABLET | ORAL | Status: DC | PRN
  Administered 2013-02-13 – 2013-02-14 (×3): 2 via ORAL
  Filled 2013-02-13 (×3): qty 2

## 2013-02-13 MED ORDER — GLYCOPYRROLATE 0.2 MG/ML IJ SOLN
INTRAMUSCULAR | Status: DC | PRN
  Administered 2013-02-13: .9 mg via INTRAVENOUS

## 2013-02-13 MED ORDER — LIRAGLUTIDE 18 MG/3ML ~~LOC~~ SOPN: 1.8000 mg | PEN_INJECTOR | Freq: Every day | SUBCUTANEOUS | Status: DC

## 2013-02-13 MED ORDER — SENNA-DOCUSATE SODIUM 8.6-50 MG PO TABS: 2.0000 | ORAL_TABLET | Freq: Every day | ORAL | Status: DC

## 2013-02-13 MED ORDER — ROCURONIUM BROMIDE 100 MG/10ML IV SOLN
INTRAVENOUS | Status: DC | PRN
  Administered 2013-02-13: 20 mg via INTRAVENOUS
  Administered 2013-02-13 (×2): 10 mg via INTRAVENOUS
  Administered 2013-02-13: 50 mg via INTRAVENOUS

## 2013-02-13 MED ORDER — OXYCODONE-ACETAMINOPHEN 10-325 MG PO TABS: 1.0000 | ORAL_TABLET | Freq: Four times a day (QID) | ORAL | Status: DC | PRN

## 2013-02-13 MED ORDER — FENTANYL CITRATE 0.05 MG/ML IJ SOLN
INTRAMUSCULAR | Status: DC | PRN
  Administered 2013-02-13 (×3): 50 ug via INTRAVENOUS

## 2013-02-13 MED ORDER — INSULIN ASPART 100 UNIT/ML ~~LOC~~ SOLN
0.0000 [IU] | Freq: Three times a day (TID) | SUBCUTANEOUS | Status: DC
  Administered 2013-02-14 (×2): 5 [IU] via SUBCUTANEOUS

## 2013-02-13 MED ORDER — OXYCODONE HCL 5 MG PO TABS: 5.0000 mg | ORAL_TABLET | Freq: Once | ORAL | Status: DC | PRN

## 2013-02-13 MED ORDER — SODIUM CHLORIDE 0.9 % IR SOLN
Status: DC | PRN
  Administered 2013-02-13: 1000 mL
  Administered 2013-02-13: 3000 mL

## 2013-02-13 MED ORDER — BUPIVACAINE HCL (PF) 0.25 % IJ SOLN
INTRAMUSCULAR | Status: DC | PRN
  Administered 2013-02-13: 20 mL

## 2013-02-13 MED ORDER — PHENOL 1.4 % MT LIQD: 1.0000 | OROMUCOSAL | Status: DC | PRN

## 2013-02-13 MED ORDER — METOCLOPRAMIDE HCL 5 MG/ML IJ SOLN
INTRAMUSCULAR | Status: DC | PRN
  Administered 2013-02-13: 10 mg via INTRAVENOUS

## 2013-02-13 MED ORDER — ASPIRIN EC 81 MG PO TBEC
81.0000 mg | DELAYED_RELEASE_TABLET | Freq: Every day | ORAL | Status: DC
  Administered 2013-02-13 – 2013-02-14 (×2): 81 mg via ORAL
  Filled 2013-02-13 (×3): qty 1

## 2013-02-13 MED ORDER — PROMETHAZINE HCL 25 MG PO TABS: 25.0000 mg | ORAL_TABLET | Freq: Four times a day (QID) | ORAL | Status: DC | PRN

## 2013-02-13 MED ORDER — INSULIN ASPART 100 UNIT/ML ~~LOC~~ SOLN
16.0000 [IU] | Freq: Every day | SUBCUTANEOUS | Status: DC
  Administered 2013-02-13: 16 [IU] via SUBCUTANEOUS

## 2013-02-13 MED ORDER — METHOCARBAMOL 500 MG PO TABS
500.0000 mg | ORAL_TABLET | Freq: Four times a day (QID) | ORAL | Status: DC | PRN
  Administered 2013-02-14: 500 mg via ORAL
  Filled 2013-02-13: qty 1

## 2013-02-13 MED ORDER — LIDOCAINE HCL (CARDIAC) 20 MG/ML IV SOLN
INTRAVENOUS | Status: DC | PRN
  Administered 2013-02-13: 60 mg via INTRAVENOUS

## 2013-02-13 MED ORDER — BISACODYL 10 MG RE SUPP: 10.0000 mg | Freq: Every day | RECTAL | Status: DC | PRN

## 2013-02-13 SURGICAL SUPPLY — 75 items
BENZOIN TINCTURE PRP APPL 2/3 (GAUZE/BANDAGES/DRESSINGS) ×2 IMPLANT
BLADE SAW SGTL MED 73X18.5 STR (BLADE) ×2 IMPLANT
BOOTCOVER CLEANROOM LRG (PROTECTIVE WEAR) ×4 IMPLANT
BOWL SMART MIX CTS (DISPOSABLE) IMPLANT
BRUSH FEMORAL CANAL (MISCELLANEOUS) IMPLANT
CAP TOTAL SHOULDER ×2 IMPLANT
CEMENT BONE DEPUY (Cement) ×2 IMPLANT
CLOTH BEACON ORANGE TIMEOUT ST (SAFETY) IMPLANT
CLSR STERI-STRIP ANTIMIC 1/2X4 (GAUZE/BANDAGES/DRESSINGS) ×4 IMPLANT
COVER SURGICAL LIGHT HANDLE (MISCELLANEOUS) ×2 IMPLANT
COVER TABLE BACK 60X90 (DRAPES) IMPLANT
DRAPE C-ARM 42X72 X-RAY (DRAPES) IMPLANT
DRAPE INCISE IOBAN 66X45 STRL (DRAPES) ×2 IMPLANT
DRAPE U-SHAPE 47X51 STRL (DRAPES) ×2 IMPLANT
DRSG MEPILEX BORDER 4X8 (GAUZE/BANDAGES/DRESSINGS) ×2 IMPLANT
DRSG PAD ABDOMINAL 8X10 ST (GAUZE/BANDAGES/DRESSINGS) IMPLANT
DURAPREP 26ML APPLICATOR (WOUND CARE) ×2 IMPLANT
ELECT BLADE 6.5 EXT (BLADE) IMPLANT
ELECT NEEDLE TIP 2.8 STRL (NEEDLE) ×2 IMPLANT
ELECT REM PT RETURN 9FT ADLT (ELECTROSURGICAL) ×2
ELECTRODE REM PT RTRN 9FT ADLT (ELECTROSURGICAL) ×1 IMPLANT
EVACUATOR 1/8 PVC DRAIN (DRAIN) IMPLANT
FACESHIELD LNG OPTICON STERILE (SAFETY) IMPLANT
GLOVE BIOGEL PI IND STRL 6.5 (GLOVE) ×1 IMPLANT
GLOVE BIOGEL PI IND STRL 7.0 (GLOVE) ×1 IMPLANT
GLOVE BIOGEL PI IND STRL 8 (GLOVE) ×2 IMPLANT
GLOVE BIOGEL PI INDICATOR 6.5 (GLOVE) ×1
GLOVE BIOGEL PI INDICATOR 7.0 (GLOVE) ×1
GLOVE BIOGEL PI INDICATOR 8 (GLOVE) ×2
GLOVE BIOGEL PI ORTHO PRO SZ8 (GLOVE) ×2
GLOVE ECLIPSE 6.5 STRL STRAW (GLOVE) ×2 IMPLANT
GLOVE ORTHO TXT STRL SZ7.5 (GLOVE) ×2 IMPLANT
GLOVE PI ORTHO PRO STRL SZ8 (GLOVE) ×2 IMPLANT
GLOVE SURG ORTHO 8.0 STRL STRW (GLOVE) ×4 IMPLANT
GLOVE SURG SS PI 8.0 STRL IVOR (GLOVE) ×4 IMPLANT
GOWN BRE IMP PREV XXLGXLNG (GOWN DISPOSABLE) ×2 IMPLANT
GOWN BRE IMP SLV AUR XL STRL (GOWN DISPOSABLE) ×2 IMPLANT
GOWN STRL REIN 2XL LVL4 (GOWN DISPOSABLE) ×4 IMPLANT
GOWN STRL REIN XL XLG (GOWN DISPOSABLE) ×2 IMPLANT
HANDPIECE INTERPULSE COAX TIP (DISPOSABLE) ×1
HOOD PEEL AWAY FACE SHEILD DIS (HOOD) ×4 IMPLANT
KIT BASIN OR (CUSTOM PROCEDURE TRAY) ×2 IMPLANT
KIT ROOM TURNOVER OR (KITS) ×2 IMPLANT
MANIFOLD NEPTUNE II (INSTRUMENTS) ×2 IMPLANT
NEEDLE 1/2 CIR CATGUT .05X1.09 (NEEDLE) ×2 IMPLANT
NEEDLE HYPO 25GX1X1/2 BEV (NEEDLE) ×2 IMPLANT
NS IRRIG 1000ML POUR BTL (IV SOLUTION) ×2 IMPLANT
PACK SHOULDER (CUSTOM PROCEDURE TRAY) ×2 IMPLANT
PAD ARMBOARD 7.5X6 YLW CONV (MISCELLANEOUS) ×4 IMPLANT
PIN HUMERAL STMN 3.2MMX9IN (INSTRUMENTS) ×2 IMPLANT
RETRIEVER SUT HEWSON (MISCELLANEOUS) ×2 IMPLANT
SET HNDPC FAN SPRY TIP SCT (DISPOSABLE) ×1 IMPLANT
SLING ARM IMMOBILIZER LRG (SOFTGOODS) IMPLANT
SLING ARM IMMOBILIZER MED (SOFTGOODS) IMPLANT
SLING ARM IMMOBILIZER XL (CAST SUPPLIES) ×2 IMPLANT
SMARTMIX MINI TOWER (MISCELLANEOUS) ×2
SPONGE GAUZE 4X4 12PLY (GAUZE/BANDAGES/DRESSINGS) IMPLANT
SPONGE LAP 18X18 X RAY DECT (DISPOSABLE) ×2 IMPLANT
STRIP CLOSURE SKIN 1/2X4 (GAUZE/BANDAGES/DRESSINGS) IMPLANT
SUCTION FRAZIER TIP 10 FR DISP (SUCTIONS) ×2 IMPLANT
SUPPORT WRAP ARM LG (MISCELLANEOUS) ×2 IMPLANT
SUT FIBERWIRE #2 38 REV NDL BL (SUTURE) ×10
SUT MNCRL AB 4-0 PS2 18 (SUTURE) ×2 IMPLANT
SUT VIC AB 0 CT1 27 (SUTURE) ×1
SUT VIC AB 0 CT1 27XBRD ANBCTR (SUTURE) ×1 IMPLANT
SUT VIC AB 2-0 CT1 27 (SUTURE) ×1
SUT VIC AB 2-0 CT1 TAPERPNT 27 (SUTURE) ×1 IMPLANT
SUT VIC AB 3-0 SH 18 (SUTURE) ×2 IMPLANT
SUTURE FIBERWR#2 38 REV NDL BL (SUTURE) ×5 IMPLANT
SYR CONTROL 10ML LL (SYRINGE) ×2 IMPLANT
TOWEL OR 17X24 6PK STRL BLUE (TOWEL DISPOSABLE) ×2 IMPLANT
TOWEL OR 17X26 10 PK STRL BLUE (TOWEL DISPOSABLE) ×2 IMPLANT
TOWER SMARTMIX MINI (MISCELLANEOUS) ×1 IMPLANT
TRAY FOLEY CATH 16FRSI W/METER (SET/KITS/TRAYS/PACK) ×2 IMPLANT
WATER STERILE IRR 1000ML POUR (IV SOLUTION) ×2 IMPLANT

## 2013-02-13 NOTE — Op Note (Signed)
02/13/2013  10:08 AM  PATIENT:  Jamie Figueroa    PRE-OPERATIVE DIAGNOSIS:  DJD RIGHT SHOULDER  POST-OPERATIVE DIAGNOSIS:  Same  PROCEDURE:  TOTAL SHOULDER ARTHROPLASTY  SURGEON:  Eulas Post, MD  PHYSICIAN ASSISTANT: Janace Litten, OPA-C, present and scrubbed throughout the case, critical for completion in a timely fashion, and for retraction, instrumentation, and closure.  ANESTHESIA:   General  PREOPERATIVE INDICATIONS:  Jamie Figueroa is a  62 y.o. male with a diagnosis of DJD RIGHT SHOULDER who failed conservative measures and elected for surgical management.    The risks benefits and alternatives were discussed with the patient preoperatively including but not limited to the risks of infection, bleeding, nerve injury, cardiopulmonary complications, the need for revision surgery, dislocation, loosening, incomplete relief of pain, among others, and the patient was willing to proceed.   OPERATIVE IMPLANTS: Biomet size 12 mini press-fit humeral stem, size 46+21 Versa-dial humeral head, set in the E position with increased coverage posteriorly, with a medium cemented glenoid polyethylene 3 peg implant with a central regenerex noncemented post.   OPERATIVE FINDINGS: Advanced glenohumeral osteoarthritis involving the glenoid and the humeral head with osteophyte formation inferiorly.   OPERATIVE PROCEDURE: The patient was brought to the operating room and placed in the supine position. General anesthesia was administered. IV antibiotics were given.  A foley was placed. The upper extremity was prepped and draped in usual sterile fashion. The patient was in a beachchair position with all bony prominences padded.   Time out was performed and a deltopectoral approach was carried out. The biceps tendon was tenodesed to the pectoralis tendon. The subscapularis was released, tagging it with a #2 FiberWire, leaving a cuff of tendon for repair.   The inferior osteophyte was removed, and  release of the capsule off of the humeral side was completed. The head was dislocated, and I reamed sequentially. I placed the humeral cutting guide at 30 of retroversion, and then pinned this into place, and made my humeral neck cut. This was at the appropriate level. I did have to cut twice to get adequate access to the glenoid.  I then placed deep retractors and exposed the glenoid. I excised the labrum circumferentially, taking care to protect the axillary nerve inferiorly.   I then placed a guidewire into the center position, controlling appropriate version and inclination. I then reamed over the guidewire with the small reamer, and was satisfied with the preparation. I preserved the subchondral bone in order to maximize the strength and minimize the risk for subsequent subsidence.   I then drilled the central hole for the regenerex peg, and then placed the guide, and then drilled the 3 peripheral peg holes. I had excellent bony circumferential contact. The central hole did perforate deep, although I was well centered on the face of the glenoid. I did remove slightly more bone anteriorly then posteriorly, in an effort to neutralize the glenoid version. I had excellent bony walls on the deep aspect superiorly, posteriorly, but was slightly soft anteriorly.  I then cleaned the glenoid, irrigated it copiously, and then dried it and cemented the prosthesis into place. Excellent seating was achieved. I had full exposure. The cement cured, and then I turned my attention to the humeral side.   I sequentially broached, up to the selected size, with the broach set at 30 of retroversion. I then placed the real stem. I trialed with multiple heads, and the above-named component was selected. Increased posterior coverage improved the coverage. The size  50 was slightly too large, and so I went with the smaller size in order to minimize prominence. The soft tissue tension was appropriate. The head translated 50%  with the trial in place to the back.  I then impacted the real humeral head into place, reduced the head, and irrigated copiously. Excellent stability and range of motion was achieved. I repaired the subscapularis with 4 #2 FiberWire, as well as the rotator interval, and irrigated copiously once more. The subcutaneous tissue was closed with Vicryl including the deltopectoral fascia.   The skin was closed with Steri-Strips and sterile gauze was applied. He had a preoperative nerve block. He tolerated the procedure well and there were no complications.

## 2013-02-13 NOTE — Progress Notes (Signed)
Utilization Review Completed.Katha Kuehne T11/07/2012  

## 2013-02-13 NOTE — Anesthesia Postprocedure Evaluation (Signed)
Anesthesia Post Note  Patient: Jamie Figueroa  Procedure(s) Performed: Procedure(s) (LRB): TOTAL SHOULDER ARTHROPLASTY (Right)  Anesthesia type: General  Patient location: PACU  Post pain: Pain level controlled  Post assessment: Patient's Cardiovascular Status Stable  Last Vitals:  Filed Vitals:   02/13/13 1123  BP: 120/51  Pulse: 64  Temp: 36.2 C  Resp: 17    Post vital signs: Reviewed and stable  Level of consciousness: alert  Complications: No apparent anesthesia complications

## 2013-02-13 NOTE — Preoperative (Signed)
Beta Blockers   Reason not to administer Beta Blockers:Not Applicable 

## 2013-02-13 NOTE — Transfer of Care (Signed)
Immediate Anesthesia Transfer of Care Note  Patient: Jamie Figueroa  Procedure(s) Performed: Procedure(s): TOTAL SHOULDER ARTHROPLASTY (Right)  Patient Location: PACU  Anesthesia Type:General  Level of Consciousness: sedated  Airway & Oxygen Therapy: Patient Spontanous Breathing and Patient connected to nasal cannula oxygen  Post-op Assessment: Report given to PACU RN and Post -op Vital signs reviewed and stable  Post vital signs: stable  Complications: No apparent anesthesia complications

## 2013-02-13 NOTE — Progress Notes (Signed)
Orthopedic Tech Progress Note Patient Details:  Jamie Figueroa Jun 12, 1950 [id number] Shoulder immobilizer issued in OR does not fit patient well. Ortho tech re-fit patient with new immobilizer. Application tolerated well. OHF applied to patient's bed. Ortho Devices Type of Ortho Device: Shoulder immobilizer Ortho Device/Splint Location: Right UE Ortho Device/Splint Interventions: Application   Asia R Thompson 02/13/2013, 1:19 PM

## 2013-02-13 NOTE — H&P (Signed)
PREOPERATIVE H&P  Chief Complaint: DJD RIGHT SHOULDER  HPI: Jamie Figueroa is a 62 y.o. male who presents for preoperative history and physical with a diagnosis of DJD RIGHT SHOULDER. Symptoms are rated as moderate to severe, and have been worsening.  This is significantly impairing activities of daily living.  He has elected for surgical management. Failed injections, nsaids, activity modification, time.    Past Medical History  Diagnosis Date  . Hypercholesteremia   . Morbid obesity   . Irritable bowel syndrome   . Pain in limb   . Obstructive sleep apnea (adult) (pediatric)   . Snoring 12/07/2012  . Hyperlipidemia     takes Vytorin daily  . HTN (hypertension)     takes Ramipril daily  . Shortness of breath     with exertion  . Pneumonia     hx of;about 48yrs ago  . Headache(784.0)     occasionally  . Arthritis   . Joint pain   . Peripheral edema     takes Furosemide daily  . History of gout   . Psoriasis   . GERD (gastroesophageal reflux disease)     but doesn't take any meds  . Hemorrhoids   . Constipation   . IBS (irritable bowel syndrome)   . Depression     but no meds required  . Insomnia     doesn't require meds  . DM (diabetes mellitus)     takes Victoza daily;Humalog and Levemir daily   Past Surgical History  Procedure Laterality Date  . Knee surgery Bilateral   . Ankle surgery Right   . Colonoscopy    . Esophagogastroduodenoscopy     History   Social History  . Marital Status: Married    Spouse Name: N/A    Number of Children: N/A  . Years of Education: N/A   Social History Main Topics  . Smoking status: Never Smoker   . Smokeless tobacco: None  . Alcohol Use: 0.0 oz/week     Comment: occasionally beer  . Drug Use: No  . Sexual Activity: Yes   Other Topics Concern  . None   Social History Narrative  . None   Family History  Problem Relation Age of Onset  . Diabetes Mother    No Known Allergies Prior to Admission medications    Medication Sig Start Date End Date Taking? Authorizing Provider  aspirin 81 MG tablet Take 81 mg by mouth daily.   Yes Historical Provider, MD  Choline Fenofibrate (FENOFIBRIC ACID) 135 MG CPDR Take 1 tablet by mouth daily.  11/20/12  Yes Historical Provider, MD  furosemide (LASIX) 40 MG tablet Take 40 mg by mouth daily.  10/21/12  Yes Historical Provider, MD  Glucosamine Sulfate 1000 MG TABS Take 1 tablet by mouth daily.   Yes Historical Provider, MD  LEVEMIR FLEXTOUCH 100 UNIT/ML SOPN Inject 56 Units into the skin 2 (two) times daily.  12/04/12  Yes Historical Provider, MD  NOVOLOG FLEXPEN 100 UNIT/ML SOPN FlexPen Inject 16 Units into the skin daily. Takes before his biggest meal 10/21/12  Yes Historical Provider, MD  POTASSIUM GLUCONATE PO Take 1 tablet by mouth daily.   Yes Historical Provider, MD  ramipril (ALTACE) 10 MG capsule Take 10 mg by mouth daily.  11/20/12  Yes Historical Provider, MD  VICTOZA 18 MG/3ML SOPN Inject 1.8 mg into the skin daily.  10/23/12  Yes Historical Provider, MD  VYTORIN 10-40 MG per tablet Take 1 tablet by mouth daily.  11/28/12  Yes Historical Provider, MD  NOVOTWIST 32G X 5 MM MISC Pt uses 5 needles daily 10/24/12   Historical Provider, MD  ONE TOUCH ULTRA TEST test strip  11/29/12   Historical Provider, MD     Positive ROS: All other systems have been reviewed and were otherwise negative with the exception of those mentioned in the HPI and as above.  Physical Exam: General: Alert, no acute distress Cardiovascular: No pedal edema Respiratory: No cyanosis, no use of accessory musculature GI: No organomegaly, abdomen is soft and non-tender Skin: No lesions in the area of chief complaint Neurologic: Sensation intact distally Psychiatric: Patient is competent for consent with normal mood and affect Lymphatic: No axillary or cervical lymphadenopathy  MUSCULOSKELETAL: right shoulder AROM 0-80 deg, cuff strength fair, limited by pain  Assessment: DJD RIGHT  SHOULDER  Plan: Plan for Procedure(s): TOTAL SHOULDER ARTHROPLASTY  The risks benefits and alternatives were discussed with the patient including but not limited to the risks of nonoperative treatment, versus surgical intervention including infection, bleeding, nerve injury,  blood clots, cardiopulmonary complications, morbidity, mortality, among others, and they were willing to proceed.   Eulas Post, MD Cell [phone]   02/13/2013 7:22 AM

## 2013-02-13 NOTE — Anesthesia Procedure Notes (Signed)
Anesthesia Regional Block:  Interscalene brachial plexus block  Pre-Anesthetic Checklist: ,, timeout performed, Correct Patient, Correct Site, Correct Laterality, Correct Procedure, Correct Position, site marked, Risks and benefits discussed,  Surgical consent,  Pre-op evaluation,  At surgeon's request and post-op pain management  Laterality: Right  Prep: chloraprep       Needles:   Needle Type: Other     Needle Length: 9cm  Needle Gauge: 21 and 21 G    Additional Needles:  Procedures: ultrasound guided (picture in chart) Interscalene brachial plexus block Narrative:  Start time: 02/13/2013 6:55 AM End time: 02/13/2013 7:05 AM Injection made incrementally with aspirations every 5 mL.  Performed by: Personally  Anesthesiologist: Aldona Lento, MD  Additional Notes: Ultrasound guidance used to: id relevant anatomy, confirm needle position, local anesthetic spread, avoidance of vascular puncture. Picture saved. No complications. Block performed personally by Janetta Hora. Gelene Mink, MD    Interscalene brachial plexus block

## 2013-02-13 NOTE — Anesthesia Preprocedure Evaluation (Addendum)
Anesthesia Evaluation  Patient identified by MRN, date of birth, ID band Patient awake    Reviewed: Allergy & Precautions, H&P , NPO status , Patient's Chart, lab work & pertinent test results, reviewed documented beta blocker date and time   Airway Mallampati: II TM Distance: >3 FB Neck ROM: full    Dental  (+) Dental Advisory Given   Pulmonary shortness of breath, sleep apnea , pneumonia -, resolved,  breath sounds clear to auscultation        Cardiovascular hypertension, Pt. on medications Rhythm:regular     Neuro/Psych  Headaches, PSYCHIATRIC DISORDERS Depression negative psych ROS   GI/Hepatic Neg liver ROS, GERD-  ,  Endo/Other  diabetes, Type 2, Insulin DependentMorbid obesity  Renal/GU negative Renal ROS     Musculoskeletal  (+) Arthritis -,   Abdominal   Peds  Hematology negative hematology ROS (+)   Anesthesia Other Findings See surgeon's H&P   Reproductive/Obstetrics negative OB ROS                          Anesthesia Physical Anesthesia Plan  ASA: III  Anesthesia Plan: General   Post-op Pain Management:    Induction: Intravenous  Airway Management Planned: Oral ETT  Additional Equipment:   Intra-op Plan:   Post-operative Plan: Extubation in OR  Informed Consent: I have reviewed the patients History and Physical, chart, labs and discussed the procedure including the risks, benefits and alternatives for the proposed anesthesia with the patient or authorized representative who has indicated his/her understanding and acceptance.   Dental advisory given  Plan Discussed with: Anesthesiologist and Surgeon  Anesthesia Plan Comments:         Anesthesia Quick Evaluation

## 2013-02-14 LAB — BASIC METABOLIC PANEL
BUN: 21 mg/dL (ref 6–23)
Calcium: 8.4 mg/dL (ref 8.4–10.5)
Chloride: 104 mEq/L (ref 96–112)
GFR calc non Af Amer: 60 mL/min — ABNORMAL LOW (ref >90)
Glucose, Bld: 231 mg/dL — ABNORMAL HIGH (ref 70–99)

## 2013-02-14 LAB — CBC
HCT: 37.5 % — ABNORMAL LOW (ref 39.0–52.0)
Hemoglobin: 12.6 g/dL — ABNORMAL LOW (ref 13.0–17.0)
MCH: 32.6 pg (ref 26.0–34.0)
MCHC: 33.6 g/dL (ref 30.0–36.0)
RDW: 13 % (ref 11.5–15.5)

## 2013-02-14 LAB — GLUCOSE, CAPILLARY: Glucose-Capillary: 227 mg/dL — ABNORMAL HIGH (ref 70–99)

## 2013-02-14 NOTE — Discharge Summary (Signed)
Physician Discharge Summary  Patient ID: Jamie Figueroa MRN: [id number] DOB/AGE: 63-Aug-1952 63 y.o.  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 Hypercholesteremia   . Morbid obesity   . Irritable bowel syndrome   . Pain in limb   . Obstructive sleep apnea (adult) (pediatric)   . Snoring 12/07/2012  . Hyperlipidemia     takes Vytorin daily  . HTN (hypertension)     takes Ramipril daily  . Shortness of breath     with exertion  . Pneumonia     hx of;about 90yrs ago  . Headache(784.0)     occasionally  . Arthritis   . Joint pain   . Peripheral edema     takes Furosemide daily  . History of gout   . Psoriasis   . GERD (gastroesophageal reflux disease)     but doesn't take any meds  . Hemorrhoids   . Constipation   . IBS (irritable bowel syndrome)   . Depression     but no meds required  . Insomnia     doesn't require meds  . DM (diabetes mellitus)     takes Victoza daily;Humalog and Levemir daily  . Osteoarthritis of right shoulder region 02/13/2013    Surgeries: Procedure(s): TOTAL SHOULDER ARTHROPLASTY on 02/13/2013   Consultants (if any):    Discharged Condition: Improved  Hospital Course: Jamie Figueroa is an 62 y.o. male who was admitted 02/13/2013 with a diagnosis of Osteoarthritis of right shoulder region and went to the operating room on 02/13/2013 and underwent the above named procedures.    He was given perioperative antibiotics:  Anti-infectives   Start     Dose/Rate Route Frequency Ordered Stop   02/13/13 1330  ceFAZolin (ANCEF) IVPB 1 g/50 mL premix     1 g 100 mL/hr over 30 Minutes Intravenous Every 6 hours 02/13/13 1205 02/14/13 0250   02/13/13 0600  ceFAZolin (ANCEF) 3 g in dextrose 5 % 50  mL IVPB     3 g 160 mL/hr over 30 Minutes Intravenous On call to O.R. 02/12/13 1432 02/13/13 0731    .  He was given sequential compression devices, early ambulation, and lovenox for DVT prophylaxis.  He benefited maximally from the hospital stay and there were no complications.  He also complained of severe left shoulder shoulder pain, stiffness, known OA, and requested an injection and this was given intraarticularly to his nonop shoulder with 4 xylocaine and 1ml depomedrol.  Recent vital signs:  Filed Vitals:   02/14/13 0222  BP: 130/61  Pulse: 70  Temp: 98.1 F (36.7 C)  Resp: 18    Recent laboratory studies:  Lab Results  Component Value Date   HGB 12.6* 02/14/2013   HGB 14.5 02/13/2013   HGB 15.3 02/05/2013   Lab Results  Component Value Date   WBC 13.1* 02/14/2013   PLT 187 02/14/2013   No results found for this basename: INR   Lab Results  Component Value Date   NA 136 02/14/2013   K 4.6 02/14/2013   CL 104 02/14/2013   CO2 23 02/14/2013   BUN 21 02/14/2013   CREATININE 1.25 02/14/2013   GLUCOSE 231* 02/14/2013    Discharge Medications:     Medication List  aspirin 81 MG tablet  Take 81 mg by mouth daily.     Fenofibric Acid 135 MG Cpdr  Take 1 tablet by mouth daily.     furosemide 40 MG tablet  Commonly known as:  LASIX  Take 40 mg by mouth daily.     Glucosamine Sulfate 1000 MG Tabs  Take 1 tablet by mouth daily.     LEVEMIR FLEXTOUCH 100 UNIT/ML Sopn  Generic drug:  Insulin Detemir  Inject 56 Units into the skin 2 (two) times daily.     methocarbamol 500 MG tablet  Commonly known as:  ROBAXIN  Take 1 tablet (500 mg total) by mouth 4 (four) times daily.     NOVOLOG FLEXPEN 100 UNIT/ML Sopn FlexPen  Generic drug:  insulin aspart  Inject 16 Units into the skin daily. Takes before his biggest meal     NOVOTWIST 32G X 5 MM Misc  Generic drug:  Insulin Pen Needle  Pt uses 5 needles daily     ONE TOUCH ULTRA TEST test strip  Generic  drug:  glucose blood     oxyCODONE-acetaminophen 10-325 MG per tablet  Commonly known as:  PERCOCET  Take 1-2 tablets by mouth every 6 (six) hours as needed for pain. MAXIMUM TOTAL ACETAMINOPHEN DOSE IS 4000 MG PER DAY     POTASSIUM GLUCONATE PO  Take 1 tablet by mouth daily.     promethazine 25 MG tablet  Commonly known as:  PHENERGAN  Take 1 tablet (25 mg total) by mouth every 6 (six) hours as needed for nausea or vomiting.     ramipril 10 MG capsule  Commonly known as:  ALTACE  Take 10 mg by mouth daily.     sennosides-docusate sodium 8.6-50 MG tablet  Commonly known as:  SENOKOT-S  Take 2 tablets by mouth daily.     VICTOZA 18 MG/3ML Sopn  Generic drug:  Liraglutide  Inject 1.8 mg into the skin daily.     VYTORIN 10-40 MG per tablet  Generic drug:  ezetimibe-simvastatin  Take 1 tablet by mouth daily.        Diagnostic Studies: Dg Chest 2 View  02/05/2013   CLINICAL DATA:  Pre-admission film prior to right shoulder replacement.  EXAM: CHEST  2 VIEW  COMPARISON:  No priors.  FINDINGS: Lung volumes are normal. No consolidative airspace disease. No pleural effusions. No pneumothorax. No pulmonary nodule or mass noted. Pulmonary vasculature and the cardiomediastinal silhouette are within normal limits.  IMPRESSION: 1.  No radiographic evidence of acute cardiopulmonary disease.   Electronically Signed   By: Trudie Reed M.D.   On: 02/05/2013 13:10   Dg Shoulder Right  02/13/2013   CLINICAL DATA:  Postop right shoulder  EXAM: RIGHT SHOULDER - 2+ VIEW  COMPARISON:  None.  FINDINGS: There is interval placement of a right shoulder arthroplasty without hardware failure or complication. There is no obvious dislocation on a single view. There is no fracture. There are postsurgical changes in the surrounding soft tissues. There are mild degenerative changes of the acromioclavicular joint.  IMPRESSION: Right shoulder arthroplasty.   Electronically Signed   By: Elige Ko   On:  02/13/2013 10:53    Disposition: Final discharge disposition not confirmed      Discharge Orders   Future Orders Complete By Expires   Call MD / Call 911  As directed    Comments:     If you experience chest pain or shortness of breath, CALL 911 and be  transported to the hospital emergency room.  If you develope a fever above 101 F, pus (white drainage) or increased drainage or redness at the wound, or calf pain, call your surgeon's office.   Constipation Prevention  As directed    Comments:     Drink plenty of fluids.  Prune juice may be helpful.  You may use a stool softener, such as Colace (over the counter) 100 mg twice a day.  Use MiraLax (over the counter) for constipation as needed.   Diet general  As directed    Discharge instructions  As directed    Comments:     Change dressing in 3 days and reapply fresh dressing, unless you have a splint (half cast).  If you have a splint/cast, just leave in place until your follow-up appointment.    Keep wounds dry for 3 weeks.  Leave steri-strips in place on skin.  Do not apply lotion or anything to the wound.   OT splint  As directed 02/13/2014   Comments:     Ok for elbow, wrist, and hand motion.  Sling at all times except for hygiene, no shoulder activity until 6 weeks.      Follow-up Information   Follow up with Eulas Post, MD. Schedule an appointment as soon as possible for a visit in 2 weeks.   Specialty:  Orthopedic Surgery   Contact information:   [address] [place] [address] [phone]        Signed: Eulas Post 02/14/2013, 8:30 AM

## 2013-02-14 NOTE — Evaluation (Signed)
Occupational Therapy Evaluation Patient Details Name: Jamie Figueroa MRN: [id number] DOB: 08-13-1950 Today's Date: 02/14/2013 Time: 4098-1191 OT Time Calculation (min): 35 min  OT Assessment / Plan / Recommendation History of present illness TOTAL SHOULDER ARTHROPLASTY RUE via Dr. Dion Saucier   Clinical Impression   Pt has been educated, along with his wife on AROM exercises for the right elbow, wrist, and hand.  Have also provided handout as reference and have demonstrated techniques for ADLs, sling wear, and positioning.  No further OT acute needs.  Recommend OT as MD sees fit with outpatient follow-up.    OT Assessment  Progress rehab of shoulder as ordered by MD at follow-up appointment    Follow Up Recommendations  No OT follow up       Equipment Recommendations  None recommended by OT          Precautions / Restrictions Precautions Precautions: Shoulder Shoulder Interventions: Shoulder sling/immobilizer;At all times;Off for dressing/bathing/exercises Required Braces or Orthoses: Sling Restrictions Weight Bearing Restrictions: Yes RLE Weight Bearing: Non weight bearing   Pertinent Vitals/Pain Pt reported pain 2/10 in the right shoulder, pt using ice and meds given earlier.    ADL  Eating/Feeding: Simulated;Modified independent Where Assessed - Eating/Feeding: Chair Grooming: Simulated;Modified independent Where Assessed - Grooming: Unsupported sitting Upper Body Bathing: Simulated;Minimal assistance Where Assessed - Upper Body Bathing: Unsupported sitting Lower Body Bathing: Simulated;Minimal assistance Where Assessed - Lower Body Bathing: Supported sit to stand Upper Body Dressing: Simulated;Moderate assistance Where Assessed - Upper Body Dressing: Unsupported standing Lower Body Dressing: Simulated;Minimal assistance Where Assessed - Lower Body Dressing: Supported sit to stand Toilet Transfer: Performed;Independent Acupuncturist: Comfort height  toilet Toileting - Clothing Manipulation and Hygiene: Simulated;Modified independent Where Assessed - Toileting Clothing Manipulation and Hygiene: Sit to stand from 3-in-1 or toilet Tub/Shower Transfer Method: Not assessed Equipment Used: Other (comment) (sling) Transfers/Ambulation Related to ADLs: Pt able to transfer with independence and mobilize independently in the hallway. ADL Comments: Pt and wife educated on positioning, AROM for the non-involved joints, sling wear, and ADLs.  No further OT needs.      Visit Information  Last OT Received On: 02/14/13 Assistance Needed: +1 History of Present Illness: TOTAL SHOULDER ARTHROPLASTY RUE via Dr. Dion Saucier       Prior Functioning     Home Living Family/patient expects to be discharged to:: Private residence Living Arrangements: Spouse/significant other Available Help at Discharge: Family Type of Home: House Home Equipment: None Prior Function Level of Independence: Independent Communication Communication: No difficulties Dominant Hand: Right         Vision/Perception Vision - History Baseline Vision: No visual deficits Patient Visual Report: No change from baseline Vision - Assessment Eye Alignment: Within Functional Limits Vision Assessment: Vision not tested Perception Perception: Within Functional Limits Praxis Praxis: Intact   Cognition  Cognition Arousal/Alertness: Awake/alert Behavior During Therapy: WFL for tasks assessed/performed Overall Cognitive Status: Within Functional Limits for tasks assessed    Extremity/Trunk Assessment Upper Extremity Assessment Upper Extremity Assessment: RUE deficits/detail RUE Deficits / Details: RUE slinged secondary to his surgery.  Pt with good AROM at the elbow and wrist and digits.   RUE: Unable to fully assess due to immobilization RUE Sensation: decreased light touch Lower Extremity Assessment Lower Extremity Assessment: Overall WFL for tasks assessed Cervical /  Trunk Assessment Cervical / Trunk Assessment: Normal     Mobility Bed Mobility Bed Mobility: Supine to Sit Supine to Sit: 3: Mod assist;HOB elevated Details for Bed Mobility Assistance: Pt needed  assistance with lifiting trunk from the bed Transfers Transfers: Sit to Stand;Stand to Sit Sit to Stand: 7: Independent;From bed Stand to Sit: 7: Independent;To bed     Exercise Shoulder Exercises Elbow Flexion: AROM;Right;10 reps;Standing Elbow Extension: AROM;Right;10 reps;Standing Digit Composite Flexion: AROM;Right;10 reps;Standing Composite Extension: Right;10 reps;AROM;Standing Donning/doffing shirt without moving shoulder: Caregiver independent with task Method for sponge bathing under operated UE: Caregiver independent with task Donning/doffing sling/immobilizer: Caregiver independent with task Correct positioning of sling/immobilizer: Independent;Caregiver independent with task ROM for elbow, wrist and digits of operated UE: Independent;Caregiver independent with task Sling wearing schedule (on at all times/off for ADL's): Independent;Caregiver independent with task Proper positioning of operated UE when showering: Caregiver independent with task;Independent Positioning of UE while sleeping: Caregiver independent with task   Balance Balance Balance Assessed: Yes Dynamic Standing Balance Dynamic Standing - Balance Support: No upper extremity supported Dynamic Standing - Level of Assistance: 7: Independent   End of Session OT - End of Session Equipment Utilized During Treatment: Other (comment) (sling) Activity Tolerance: Patient tolerated treatment well Patient left: in chair;with call bell/phone within reach;with family/visitor present Nurse Communication: Mobility status (completion of OT services)     Reymond Maynez OTR/L 02/14/2013, 9:14 AM

## 2013-02-16 ENCOUNTER — Encounter (HOSPITAL_COMMUNITY): Payer: Self-pay | Admitting: Orthopedic Surgery

## 2013-03-13 ENCOUNTER — Encounter: Payer: Self-pay | Admitting: Neurology

## 2013-03-13 ENCOUNTER — Other Ambulatory Visit: Payer: Self-pay | Admitting: Neurology

## 2013-03-13 DIAGNOSIS — G4733 Obstructive sleep apnea (adult) (pediatric): Secondary | ICD-10-CM

## 2013-03-13 NOTE — Progress Notes (Signed)
Quick Note:  I reviewed the patient's CPAP compliance data from 02/05/2013 to 03/06/2013, which is a total of 30 days, during which time the patient used CPAP every day except for 1 day. The average usage for all days was 4 hours and 50 minutes. The percent used days greater than 4 hours was 73%, indicating fairly good compliance. The residual AHI was quite high at 19.5 per hour, indicating an inadequate treatment pressure of 10 cwp with EPR of 3. I will go ahead and fax a order for increase in his CPAP pressure to 11 cm and request another 30 day download after that. His DME company is [employer]. I will also review this data with the patient at the next office visit, which is scheduled for 03/29/2013 and I will provide feedback and additional troubleshooting if need be.  Huston Foley, MD, PhD [facility] ([facility]   ______

## 2013-03-29 ENCOUNTER — Encounter: Payer: Self-pay | Admitting: Neurology

## 2013-03-29 ENCOUNTER — Ambulatory Visit (INDEPENDENT_AMBULATORY_CARE_PROVIDER_SITE_OTHER): Payer: BC Managed Care – PPO | Admitting: Neurology

## 2013-03-29 VITALS — BP 133/71 | HR 71 | Temp 98.0°F | Ht 70.0 in | Wt 285.0 lb

## 2013-03-29 DIAGNOSIS — E669 Obesity, unspecified: Secondary | ICD-10-CM

## 2013-03-29 DIAGNOSIS — R9431 Abnormal electrocardiogram [ECG] [EKG]: Secondary | ICD-10-CM

## 2013-03-29 DIAGNOSIS — G4733 Obstructive sleep apnea (adult) (pediatric): Secondary | ICD-10-CM

## 2013-03-29 DIAGNOSIS — Z96619 Presence of unspecified artificial shoulder joint: Secondary | ICD-10-CM

## 2013-03-29 DIAGNOSIS — Z96611 Presence of right artificial shoulder joint: Secondary | ICD-10-CM

## 2013-03-29 NOTE — Progress Notes (Signed)
Subjective:    Patient ID: Jamie Figueroa is a 62 y.o. male.  HPI    Interim history:   Mr. Kattner is a very pleasant 62 year old right-handed gentleman with an underlying medical history of hypertension, diabetes, hyperlipidemia, irritable bowel syndrome, chronic kidney disease and obesity, who presents for followup consultation of his obstructive sleep apnea. He is accompanied by his wife today. I first met him on 12/07/2012, at which time he complained of snoring, excessive daytime somnolence, non-restorative sleep, and disrupted sleep. I advised him to undergo a sleep study and he had a split-night sleep study on 12/25/2012 and I reviewed his test results with him in detail today. His baseline sleep efficiency was significantly reduced at 50.5% with a sleep latency of 42.5 minutes and wake after sleep onset of 78 minutes with severe sleep fragmentation noted. He had an increased percentage of stage I and stage II sleep and absence of slow-wave and REM sleep prior to CPAP. He had frequent PVCs and a brief run of tachycardia. He had severe periodic leg movements of sleep but with very little arousals. He had mild to moderate and at times loud snoring. His total AHI was 62.9 per hour. His baseline oxygen saturation was 91%, his nadir was 85%. He was then titrated on CPAP from 5-9 cm of water pressure and was briefly tried on BiPAP because of CPAP the emergent central apneas. He did not do well on BiPAP and switched back to CPAP of 9 cm and titrated to a final pressure of 10 cm with a reduction of the AHI to 6.8 per hour but no complete elimination of his sleep disordered breathing. I prescribed CPAP at 10 cm of water pressure and added a chin strap as he was noted to have significant mouth opening and oral sputtering. He did not tolerate a full face mask during the study. I reviewed compliance data from 02/05/2013 through 03/06/2013 which is a total of 30 days, during which time he uses CPAP every night  except for one night. Percent used days greater than 4 hours was 73%, indicating adequate compliance. His pressure was 10 cm with EPR of 3. His leak was acceptable. However, his residual AHI was high at 19.5 and based on this I faxed a CPAP pressure increase request to his DME company on 03/13/2013. His pressure was increased to 11 cm. Today, I also reviewed compliance due to from his machine today: 02/27/2013 to 03/28/2013 which is of total of 30 days during which time he uses CPAP every night. Percent used days greater than 4 hours was 93% indicating excellent compliance. His pressure is 11 cm with EPR of 3. His residual AHI is much improved to 4.8 per hour with an acceptable leak.  Today, he reports doing well with CPAP and he sleeps better and he is using CPAP regularly. He had R should replacement on 02/13/13, and has his R arm in a sling. He has to wear this for another 4 weeks, and is going to start PT next week. He has done well in that regard and is taking Percocet, robaxin, Phenergan, and senna as needed only. He was seen by his ortho yesterday.   His Past Medical History Is Significant For: Past Medical History  Diagnosis Date  . Hypercholesteremia   . Morbid obesity   . Irritable bowel syndrome   . Pain in limb   . Obstructive sleep apnea (adult) (pediatric)   . Snoring 12/07/2012  . Hyperlipidemia  takes Vytorin daily  . HTN (hypertension)     takes Ramipril daily  . Shortness of breath     with exertion  . Pneumonia     hx of;about 41yrs ago  . Headache(784.0)     occasionally  . Arthritis   . Joint pain   . Peripheral edema     takes Furosemide daily  . History of gout   . Psoriasis   . GERD (gastroesophageal reflux disease)     but doesn't take any meds  . Hemorrhoids   . Constipation   . IBS (irritable bowel syndrome)   . Depression     but no meds required  . Insomnia     doesn't require meds  . DM (diabetes mellitus)     takes Victoza daily;Humalog and  Levemir daily  . Osteoarthritis of right shoulder region 02/13/2013    His Past Surgical History Is Significant For: Past Surgical History  Procedure Laterality Date  . Knee surgery Bilateral   . Ankle surgery Right   . Colonoscopy    . Esophagogastroduodenoscopy    . Total shoulder replacement Right 02/13/2013    Dr Dion Saucier  . Total shoulder arthroplasty Right 02/13/2013    Procedure: TOTAL SHOULDER ARTHROPLASTY;  Surgeon: Eulas Post, MD;  Location: [facility] OR;  Service: Orthopedics;  Laterality: Right;    His Family History Is Significant For: Family History  Problem Relation Age of Onset  . Diabetes Mother     His Social History Is Significant For: History   Social History  . Marital Status: Married    Spouse Name: N/A    Number of Children: N/A  . Years of Education: N/A   Social History Main Topics  . Smoking status: Never Smoker   . Smokeless tobacco: Never Used  . Alcohol Use: 0.0 oz/week     Comment: occasionally beer  . Drug Use: No  . Sexual Activity: Yes   Other Topics Concern  . None   Social History Narrative  . None    His Allergies Are:  No Known Allergies:   His Current Medications Are:  Outpatient Encounter Prescriptions as of 03/29/2013  Medication Sig  . aspirin 81 MG tablet Take 81 mg by mouth daily.  . Choline Fenofibrate (FENOFIBRIC ACID) 135 MG CPDR Take 1 tablet by mouth daily.   . furosemide (LASIX) 40 MG tablet Take 40 mg by mouth daily.   . Glucosamine Sulfate 1000 MG TABS Take 1 tablet by mouth daily.  Marland Kitchen LEVEMIR FLEXTOUCH 100 UNIT/ML SOPN Inject 56 Units into the skin 2 (two) times daily.   . methocarbamol (ROBAXIN) 500 MG tablet Take 1 tablet (500 mg total) by mouth 4 (four) times daily.  Marland Kitchen NOVOLOG FLEXPEN 100 UNIT/ML SOPN FlexPen Inject 16 Units into the skin daily. Takes before his biggest meal  . NOVOTWIST 32G X 5 MM MISC Pt uses 5 needles daily  . ONE TOUCH ULTRA TEST test strip   . oxyCODONE-acetaminophen (PERCOCET) 10-325  MG per tablet Take 1-2 tablets by mouth every 6 (six) hours as needed for pain. MAXIMUM TOTAL ACETAMINOPHEN DOSE IS 4000 MG PER DAY  . POTASSIUM GLUCONATE PO Take 1 tablet by mouth daily.  . promethazine (PHENERGAN) 25 MG tablet Take 1 tablet (25 mg total) by mouth every 6 (six) hours as needed for nausea or vomiting.  . ramipril (ALTACE) 10 MG capsule Take 10 mg by mouth daily.   . sennosides-docusate sodium (SENOKOT-S) 8.6-50 MG tablet Take 2 tablets  by mouth daily.  Marland Kitchen VICTOZA 18 MG/3ML SOPN Inject 1.8 mg into the skin daily.   Marland Kitchen VYTORIN 10-40 MG per tablet Take 1 tablet by mouth daily.   :  Review of Systems:  Out of a complete 14 point review of systems, all are reviewed and negative with the exception of these symptoms as listed below:   Review of Systems  Constitutional: Negative.   HENT: Negative.   Eyes: Negative.   Respiratory: Negative.   Cardiovascular: Negative.   Gastrointestinal: Negative.   Endocrine: Negative.   Genitourinary: Negative.   Musculoskeletal: Negative.   Skin: Negative.   Allergic/Immunologic: Negative.   Neurological: Negative.   Hematological: Negative.   Psychiatric/Behavioral: Negative.   All other systems reviewed and are negative.    Objective:  Neurologic Exam  Physical Exam Physical Examination:   Filed Vitals:   03/29/13 1404  BP: 133/71  Pulse: 71  Temp: 98 F (36.7 C)    General Examination: The patient is a very pleasant 62 y.o. male in no acute distress. He appears well-developed and well-nourished and well groomed. He is obese. His right arm is in a sling.  HEENT: Normocephalic, atraumatic, pupils are equal, round and reactive to light and accommodation. Funduscopic exam is normal with sharp disc margins noted. Extraocular tracking is good without limitation to gaze excursion or nystagmus noted. Normal smooth pursuit is noted. Hearing is grossly intact. Face is symmetric with normal facial animation and normal facial sensation.  Speech is clear with no dysarthria noted. There is no hypophonia. There is no lip, neck/head, jaw or voice tremor. Neck is supple with full range of passive and active motion. There are no carotid bruits on auscultation. Oropharynx exam reveals: mild mouth dryness, adequate dental hygiene and moderate airway crowding, due to elongated uvula, redundant soft palate, larger tongue and tonsillar size of 1+. Mallampati is class II. Tongue protrudes centrally and palate elevates symmetrically. Neck size is 20 inches.   Chest: Clear to auscultation without wheezing, rhonchi or crackles noted.  Heart: S1+S2+0, regular and normal without murmurs, rubs or gallops noted.   Abdomen: Soft, non-tender and non-distended with normal bowel sounds appreciated on auscultation.  Extremities: There is no pitting edema in the distal lower extremities bilaterally. Pedal pulses are intact. He has limitation in R shoulder ROM and pain.  Skin: Warm and dry without trophic changes noted. There are no varicose veins.  Musculoskeletal: exam reveals no obvious joint deformities, tenderness or joint swelling or erythema I did not check his right arm as it is immobilized in a sling.   Neurologically:  Mental status: The patient is awake, alert and oriented in all 4 spheres. His memory, attention, language and knowledge are appropriate. There is no aphasia, agnosia, apraxia or anomia. Speech is clear with normal prosody and enunciation. Thought process is linear. Mood is congruent and affect is normal.  Cranial nerves are as described above under HEENT exam. In addition, shoulder shrug is normal with equal shoulder height noted. Motor exam: Normal bulk, strength and tone is noted with the exception of R arm is not tested. His grip strength is normal in the right hand. He has pain in both knees. There is no drift, tremor or rebound. Romberg is negative. Reflexes are 1+ throughout, but I did not check his R arm today. Fine motor  skills are intact with normal finger taps, normal hand movements, normal rapid alternating patting, normal foot taps and normal foot agility.  Cerebellar testing shows no dysmetria  or intention tremor on finger to nose testing. Heel to shin is unremarkable bilaterally. There is no truncal or gait ataxia.  Sensory exam is intact to light touch, pinprick, vibration, temperature sense in the upper and lower extremities, , with the exception that the right arm was not tested and not taken out of his sling.  Gait, station and balance are unremarkable. No veering to one side is noted. No leaning to one side is noted. Posture is age-appropriate and stance is narrow based. No problems turning are noted. He turns en bloc. Tandem walk is unremarkable. Intact toe and heel stance is noted.          Assessment and Plan:   In summary, JEANPIERRE THEBEAU is a very pleasant 61 y.o.-year old male with an underlying medical history of hypertension, diabetes, hyperlipidemia, irritable bowel syndrome, chronic kidney disease and obesity, who presents for followup consultation of his obstructive sleep apnea. He has done recently really well with CPAP at a pressure of 11 cm of water and I reviewed his compliance data and his sleep study results with him and his wife in detail today. I commended him for his compliance and pointed out the improvements between the last time load and this download. Increasing his pressure from 10 to 11 cm has really helped. He feels improved with respect to his sleep. He recently had shoulder replacement surgery and is going to start physical therapy. He indicates good results with the use of CPAP, and good tolerance of the pressure and mask. I reviewed the compliance data with the patient and encouraged him to continue to use CPAP regularly to help reduce cardiovascular risk.   We also talked about trying to maintaining a healthy lifestyle in general. I encouraged the patient to eat healthy, exercise  daily and keep well hydrated, to keep a scheduled bedtime and wake time routine, to not skip any meals and eat healthy snacks in between meals and to have protein with every meal. I stressed the importance of regular exercise.   I answered all their questions today and the patient and his wife were in agreement with the above outlined plan. I would like to see the patient back in 6 months, sooner if the need arises and encouraged them to call with any interim questions, concerns, problems or updates.  Most of my 25 minute visit today was spent in counseling and coordination of care, reviewing test results and reviewing medication.

## 2013-03-29 NOTE — Patient Instructions (Signed)

## 2013-04-09 ENCOUNTER — Encounter: Payer: Self-pay | Admitting: Neurology

## 2013-04-17 ENCOUNTER — Encounter: Payer: Self-pay | Admitting: Neurology

## 2013-04-17 NOTE — Progress Notes (Signed)
Quick Note:  I reviewed the patient's CPAP compliance data from 03/17/2013 to 04/14/2013, which is a total of 30 days, during which time the patient used CPAP every day except for 3 days. The average usage for all days was 4 hours and 53 minutes. The percent used days greater than 4 hours was 77 %, indicating fairly good compliance. The residual AHI was 6.2 per hour, indicating a slightly suboptimal treatment pressure of 11 cwp with EPR of 3. The airleak was at times quite high. There was a 6 day time period, during which the patient used CPAP very little. We should get in touch with his DME company and inquire regarding lapses in compliance and possible problems that would prevent him from being completely adherent to therapy. I will review this data with the patient at the next office visit, which is currently scheduled for 09/25/2013 and 12 PM, provide feedback and additional troubleshooting if need be at the time. We can also move up his appointment if the need arises.Star Age, MD, PhD [facility] ([facility]   ______

## 2013-04-23 ENCOUNTER — Encounter: Payer: Self-pay | Admitting: Neurology

## 2013-09-25 ENCOUNTER — Ambulatory Visit (INDEPENDENT_AMBULATORY_CARE_PROVIDER_SITE_OTHER): Payer: BC Managed Care – PPO | Admitting: Neurology

## 2013-09-25 ENCOUNTER — Ambulatory Visit: Payer: BC Managed Care – PPO | Admitting: Neurology

## 2013-09-25 ENCOUNTER — Encounter: Payer: Self-pay | Admitting: Neurology

## 2013-09-25 VITALS — BP 138/73 | HR 66 | Temp 97.8°F | Ht 70.0 in | Wt 290.0 lb

## 2013-09-25 DIAGNOSIS — Z9989 Dependence on other enabling machines and devices: Principal | ICD-10-CM

## 2013-09-25 DIAGNOSIS — G4710 Hypersomnia, unspecified: Secondary | ICD-10-CM

## 2013-09-25 DIAGNOSIS — G4733 Obstructive sleep apnea (adult) (pediatric): Secondary | ICD-10-CM

## 2013-09-25 DIAGNOSIS — E669 Obesity, unspecified: Secondary | ICD-10-CM

## 2013-09-25 DIAGNOSIS — R400 Somnolence: Secondary | ICD-10-CM

## 2013-09-25 NOTE — Patient Instructions (Addendum)
Please remember to try to maintain good sleep hygiene, which means: Keep a regular sleep and wake schedule, try not to exercise or have a meal within 2 hours of your bedtime, try to keep your bedroom conducive for sleep, that is, cool and dark, without light distractors such as an illuminated alarm clock, and refrain from watching TV right before sleep or in the middle of the night and do not keep the TV or radio on during the night. Also, try not to use or play on electronic devices at bedtime, such as your cell phone, tablet PC or laptop. If you like to read at bedtime on an electronic device, try to dim the background light as much as possible. Do not eat in the middle of the night.   You can try Melatonin at night for sleep: take 3 to 5 mg one to 2 hours before your projected bedtime.   Please continue using your CPAP regularly. While your insurance requires that you use CPAP at least 4 hours each night on 70% of the nights, I recommend, that you not skip any nights and use it throughout the night if you can. Getting used to CPAP and staying with the treatment long term does take time and patience and discipline. Untreated obstructive sleep apnea when it is moderate to severe can have an adverse impact on cardiovascular health and raise her risk for heart disease, arrhythmias, hypertension, congestive heart failure, stroke and diabetes. Untreated obstructive sleep apnea causes sleep disruption, nonrestorative sleep, and sleep deprivation. This can have an impact on your day to day functioning and cause daytime sleepiness and impairment of cognitive function, memory loss, mood disturbance, and problems focussing. Using CPAP regularly can improve these symptoms.  Please pick up your CPAP supplies from [place] as soon as you can.   Follow up in 6 months.

## 2013-09-25 NOTE — Progress Notes (Signed)
Subjective:    Patient ID: Jamie Figueroa is a 63 y.o. male.  HPI   Interim history:   Jamie Figueroa is a very pleasant 63 year old right-handed gentleman with an underlying medical history of hypertension, diabetes, hyperlipidemia, irritable bowel syndrome, chronic kidney disease and obesity, who presents for followup consultation of his obstructive sleep apnea. He is accompanied by his wife today. I last saw Jamie Figueroa on 03/29/13, at which time he reported doing well and was compliant with CPAP therapy. I reviewed his compliance data from 08/21/13 to 09/19/13, which is a total of 30 days, during which time he used CPAP regularly except 5 night, his average usage for all days was 4 hours and 14 minutes, and percent used days >4 hours was 63%, indicating suboptimal compliance. Residual AHI was elevated at 8.1/h (mostly obstructive) and leak was at times high, with 95th percentile at 37.5 lpm, at the setting of 11 cm and EPR of 3.   Today, he reports that he has been going to bed late and sometimes he has trouble falling asleep. He has never tried a prescription sleep aid or over-the-counter medication for sleep. He did have some lapses and treatment because he had nasal congestion. He went on a trip and did not take his machine with Jamie Figueroa. His wife corrects Jamie Figueroa and states that he actually took the machine that there was no outlet where he could use the machine. He has had an increase in his insulin. He denies any new medical issues. He believes that the leak is higher because he has not picked up his new supplies. He does note that his headgear is not as flexible. His DME company has contacted Jamie Figueroa but he has just not picked up his supplies yet. Sometimes when he takes his mask often wakes up early in the morning and decides to go back to bed he does not tend to put the CPAP on. His wife reports that she has urged Jamie Figueroa to keep the mask on and also use the CPAP longer. She has noted that he is more sleepy during the day  recently. He tends to nap in the afternoon.  I first met Jamie Figueroa on 12/07/2012, at which time he complained of snoring, excessive daytime somnolence, non-restorative sleep, and disrupted sleep. I advised Jamie Figueroa to undergo a sleep study and he had a split-night sleep study on 12/25/2012. His baseline sleep efficiency was significantly reduced at 50.5% with a sleep latency of 42.5 minutes and wake after sleep onset of 78 minutes with severe sleep fragmentation noted. He had an increased percentage of stage I and stage II sleep and absence of slow-wave and REM sleep prior to CPAP. He had frequent PVCs and a brief run of tachycardia. He had severe periodic leg movements of sleep but with very little arousals. He had mild to moderate and at times loud snoring. His total AHI was 62.9 per hour. His baseline oxygen saturation was 91%, his nadir was 85%. He was then titrated on CPAP from 5-9 cm of water pressure and was briefly tried on BiPAP because of CPAP the emergent central apneas. He did not do well on BiPAP and switched back to CPAP of 9 cm and titrated to a final pressure of 10 cm with a reduction of the AHI to 6.8 per hour but no complete elimination of his sleep disordered breathing. I prescribed CPAP at 10 cm of water pressure and added a chin strap as he was noted to have significant mouth  opening and oral sputtering. He did not tolerate a full face mask during the study.  I reviewed compliance data from 02/05/2013 through 03/06/2013 which is a total of 30 days, during which time he uses CPAP every night except for one night. Percent used days greater than 4 hours was 73%, indicating adequate compliance. His pressure was 10 cm with EPR of 3. His leak was acceptable. However, his residual AHI was high at 19.5 and based on this I faxed a CPAP pressure increase request to his DME company on 03/13/2013. His pressure was increased to 11 cm.  I reviewed compliance from 02/27/2013 to 03/28/2013 which is of total of 30 days  during which time he uses CPAP every night. Percent used days greater than 4 hours was 93% indicating excellent compliance. His pressure is 11 cm with EPR of 3. His residual AHI is much improved to 4.8 per hour with an acceptable leak.  He had R shoulder replacement on 02/13/13, and had PT.    His Past Medical History Is Significant For: Past Medical History  Diagnosis Date  . Hypercholesteremia   . Morbid obesity   . Irritable bowel syndrome   . Pain in limb   . Obstructive sleep apnea (adult) (pediatric)   . Snoring 12/07/2012  . Hyperlipidemia     takes Vytorin daily  . HTN (hypertension)     takes Ramipril daily  . Shortness of breath     with exertion  . Pneumonia     hx of;about 45yrs ago  . Headache(784.0)     occasionally  . Arthritis   . Joint pain   . Peripheral edema     takes Furosemide daily  . History of gout   . Psoriasis   . GERD (gastroesophageal reflux disease)     but doesn't take any meds  . Hemorrhoids   . Constipation   . IBS (irritable bowel syndrome)   . Depression     but no meds required  . Insomnia     doesn't require meds  . DM (diabetes mellitus)     takes Victoza daily;Humalog and Levemir daily  . Osteoarthritis of right shoulder region 02/13/2013    His Past Surgical History Is Significant For: Past Surgical History  Procedure Laterality Date  . Knee surgery Bilateral   . Ankle surgery Right   . Colonoscopy    . Esophagogastroduodenoscopy    . 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 Total shoulder arthroplasty Right 02/13/2013    Procedure: TOTAL SHOULDER ARTHROPLASTY;  Surgeon: Johnny Bridge, MD;  Location: [facility];  Service: Orthopedics;  Laterality: Right;    His Family History Is Significant For: Family History  Problem Relation Age of Onset  . Diabetes Mother     His Social History Is Significant For: History   Social History  . Marital Status: Married    Spouse Name: N/A    Number of Children: N/A   . Years of Education: N/A   Social History Main Topics  . Smoking status: Never Smoker   . Smokeless tobacco: Never Used  . Alcohol Use: 0.0 oz/week     Comment: occasionally beer  . Drug Use: No  . Sexual Activity: Yes   Other Topics Concern  . None   Social History Narrative  . None    His Allergies Are:  No Known Allergies:   His Current Medications Are:  Outpatient Encounter Prescriptions as of 09/25/2013  Medication Sig  .  aspirin 81 MG tablet Take 81 mg by mouth daily.  . Choline Fenofibrate (FENOFIBRIC ACID) 135 MG CPDR Take 1 tablet by mouth daily.   . furosemide (LASIX) 40 MG tablet Take 40 mg by mouth daily.   . Glucosamine Sulfate 1000 MG TABS Take 1 tablet by mouth daily.  Marland Kitchen LEVEMIR FLEXTOUCH 100 UNIT/ML SOPN Inject 70 Units into the skin 2 (two) times daily.   Marland Kitchen NOVOLOG FLEXPEN 100 UNIT/ML SOPN FlexPen Inject 16 Units into the skin daily. Takes before his biggest meal  . NOVOTWIST 32G X 5 MM MISC Pt uses 5 needles daily  . ONE TOUCH ULTRA TEST test strip   . POTASSIUM GLUCONATE PO Take 1 tablet by mouth daily.  . ramipril (ALTACE) 10 MG capsule Take 10 mg by mouth daily.   Marland Kitchen VICTOZA 18 MG/3ML SOPN Inject 1.8 mg into the skin daily.   Marland Kitchen VYTORIN 10-40 MG per tablet Take 1 tablet by mouth daily.   . methocarbamol (ROBAXIN) 500 MG tablet Take 1 tablet (500 mg total) by mouth 4 (four) times daily.  Marland Kitchen oxyCODONE-acetaminophen (PERCOCET) 10-325 MG per tablet Take 1-2 tablets by mouth every 6 (six) hours as needed for pain. MAXIMUM TOTAL ACETAMINOPHEN DOSE IS 4000 MG PER DAY  . promethazine (PHENERGAN) 25 MG tablet Take 1 tablet (25 mg total) by mouth every 6 (six) hours as needed for nausea or vomiting.  . sennosides-docusate sodium (SENOKOT-S) 8.6-50 MG tablet Take 2 tablets by mouth daily.  :  Review of Systems:  Out of a complete 14 point review of systems, all are reviewed and negative with the exception of these symptoms as listed below:   Review of Systems   Constitutional: Negative.   HENT: Negative.   Eyes: Negative.   Respiratory: Negative.   Cardiovascular: Negative.   Gastrointestinal: Negative.   Endocrine: Negative.   Genitourinary: Negative.   Musculoskeletal: Negative.   Skin: Negative.   Allergic/Immunologic: Negative.   Neurological: Negative.   Hematological: Negative.   Psychiatric/Behavioral: Negative.   All other systems reviewed and are negative.   Objective:  Neurologic Exam  Physical Exam Physical Examination:   Filed Vitals:   09/25/13 1059  BP: 138/73  Pulse: 66  Temp: 97.8 F (36.6 C)   General Examination: The patient is a very pleasant 63 y.o. male in no acute distress. He appears well-developed and well-nourished and well groomed. He is obese. He has gained about 5 pounds since his last visit with me.  HEENT: Normocephalic, atraumatic, pupils are equal, round and reactive to light and accommodation. Funduscopic exam is normal with sharp disc margins noted. Extraocular tracking is good without limitation to gaze excursion or nystagmus noted. Normal smooth pursuit is noted. Hearing is grossly intact. Face is symmetric with normal facial animation and normal facial sensation. Speech is clear with no dysarthria noted. There is no hypophonia. There is no lip, neck/head, jaw or voice tremor. Neck is supple with full range of passive and active motion. There are no carotid bruits on auscultation. Oropharynx exam reveals: mild mouth dryness, adequate dental hygiene and moderate airway crowding, due to elongated uvula, redundant soft palate, larger tongue and tonsillar size of 1+. Mallampati is class II. Tongue protrudes centrally and palate elevates symmetrically.   Chest: Clear to auscultation without wheezing, rhonchi or crackles noted.  Heart: S1+S2+0, regular and normal without murmurs, rubs or gallops noted.   Abdomen: Soft, non-tender and non-distended with normal bowel sounds appreciated on  auscultation.  Extremities: There is no  pitting edema in the distal lower extremities bilaterally. Pedal pulses are intact. He has very mild limitation in R shoulder ROM and no residual pain.  Skin: Warm and dry without trophic changes noted. There are no varicose veins.  Musculoskeletal: exam reveals no obvious joint deformities, tenderness or joint swelling or erythema.   Neurologically:  Mental status: The patient is awake, alert and oriented in all 4 spheres. His memory, attention, language and knowledge are appropriate. There is no aphasia, agnosia, apraxia or anomia. Speech is clear with normal prosody and enunciation. Thought process is linear. Mood is congruent and affect is normal.  Cranial nerves are as described above under HEENT exam. In addition, shoulder shrug is normal with equal shoulder height noted. Motor exam: Normal bulk, strength and tone is noted. He has mild pain in both knees. There is no drift, tremor or rebound. Romberg is negative. Reflexes are 1+ throughout. Fine motor skills are intact with normal finger taps, normal hand movements, normal rapid alternating patting, normal foot taps and normal foot agility.  Cerebellar testing shows no dysmetria or intention tremor on finger to nose testing. Heel to shin is unremarkable bilaterally. There is no truncal or gait ataxia.  Sensory exam is intact to light touch, pinprick, vibration, temperature sense in the upper and lower extremities.  Gait, station and balance are unremarkable. No veering to one side is noted. No leaning to one side is noted. Posture is age-appropriate and stance is narrow based. No problems turning are noted. He turns en bloc. Tandem walk is unremarkable. Intact toe and heel stance is noted.          Assessment and Plan:   In summary, Jamie Figueroa is a very pleasant 63 year old male with an underlying medical history of hypertension, diabetes, hyperlipidemia, irritable bowel syndrome, chronic kidney  disease and obesity, who presents for followup consultation of his severe obstructive sleep apnea on CPAP therapy. Some 6 months ago he had excellent compliance and a optimal AHI of less than 5 per hour with low leak. He has since then had some lapses and treatment. He has also not picked up his placement mask and headgear and has not been as faithful with his CPAP usage. I reiterated the importance of using CPAP regularly, especially in light of his other cardiovascular risk factors. He is advised about his sleep study results again and the importance of using CPAP as a means to improve his symptoms but also his cardiovascular risk factor profile. It looks like he has gained a few pounds as well. I did ask Jamie Figueroa to strive for weight loss and watch what he eats. He has had an increase in his insulin dose as well. Sometimes he has trouble going to sleep. For this, I have asked Jamie Figueroa to try melatonin. However, I stressed the importance of keeping her scheduled bedtime which is currently variable. He may go to bed anywhere between 11 PM and 1 AM he admits. I've asked Jamie Figueroa to take melatonin 3-5 mg 1-2 hours before his projected bedtime. We talked about his most recent compliance data and ways to improve his compliance and I believe that once he has a better fitting mask and is fully compliant his AHI while go back to where we had some 6 months ago. I have asked Jamie Figueroa to pick up his supplies from his DME company who has already contacted Jamie Figueroa a few times for his replacement mask and headgear. I answered all their questions today and the patient  and his wife were in agreement with the above outlined plan. I would like to see the patient back in 6 months, sooner if the need arises and encouraged them to call with any interim questions, concerns, problems or updates.

## 2013-10-22 ENCOUNTER — Telehealth: Payer: Self-pay | Admitting: Gastroenterology

## 2013-10-22 NOTE — Telephone Encounter (Signed)
Patient was due for recall colon in 11/2010.  He is due now.  Christy please help him schedule at his convenience.  May need to verify address.  He had 3 letters sent to him

## 2013-10-23 ENCOUNTER — Encounter: Payer: Self-pay | Admitting: Gastroenterology

## 2013-10-23 NOTE — Telephone Encounter (Signed)
LM on Pt's VM to sch'd a colonoscopy

## 2014-03-27 ENCOUNTER — Ambulatory Visit (INDEPENDENT_AMBULATORY_CARE_PROVIDER_SITE_OTHER): Payer: BC Managed Care – PPO | Admitting: Neurology

## 2014-03-27 ENCOUNTER — Encounter: Payer: Self-pay | Admitting: Neurology

## 2014-03-27 VITALS — BP 116/70 | HR 65 | Temp 98.6°F | Ht 70.0 in | Wt 274.0 lb

## 2014-03-27 DIAGNOSIS — G4733 Obstructive sleep apnea (adult) (pediatric): Secondary | ICD-10-CM

## 2014-03-27 DIAGNOSIS — E669 Obesity, unspecified: Secondary | ICD-10-CM

## 2014-03-27 DIAGNOSIS — Z9989 Dependence on other enabling machines and devices: Principal | ICD-10-CM

## 2014-03-27 NOTE — Progress Notes (Signed)
Subjective:    Patient ID: Jamie Figueroa is a 63 y.o. male.  HPI     Interim history:   Mr. Morici is a very pleasant 63 year old right-handed gentleman with an underlying medical history of hypertension, diabetes, hyperlipidemia, irritable bowel syndrome, chronic kidney disease and obesity, who presents for followup consultation of his obstructive sleep apnea. He is accompanied by his wife today. I last saw him on 09/25/13, at which time he was having difficulty with his mask. He needed new supplies. He was also taking his mask off in the middle of the night without realizing it. Sometimes in the morning hours he would not put his mask back on. He was sleepy during the day.   Today, I reviewed his compliance data from 02/23/14 to 03/24/14, which is a total of 30 days, during which time he used his machine 26 days, with percent used days greater than 4 hours of 77%, indicating adequate compliance. Average usage of 5 hours and 12 minutes for all nights. Residual AHI at 2.9 per hour indicating an appropriate treatment pressure of 11 cm with EPR of 3. Leak at times was high with the 95th percentile at 31.7 L/m.  Today, he reports doing fairly well. He did skip a few nights because he had congestion. He also indicates that he may need a new mask. He's not sure when he caught his last supplies. It may have been over 6 months ago. Overall, he is doing well. Memory wise he is for the most part stable. Sometimes his short-term memory is worse than other times. He has done well with his diabetes and his A1c is less than 7. He is now on Invokana. His knees bother him and he recently had steroid injections. He also has shoulder pain. He sees Dr. Mardelle Matte.   I saw him on 03/29/13, at which time he reported doing well and was compliant with CPAP therapy. I reviewed his compliance data from 08/21/13 to 09/19/13, which is a total of 30 days, during which time he used CPAP regularly except 5 night, his average usage for  all days was 4 hours and 14 minutes, and percent used days >4 hours was 63%, indicating suboptimal compliance. Residual AHI was elevated at 8.1/h (mostly obstructive) and leak was at times high, with 95th percentile at 37.5 lpm, at the setting of 11 cm and EPR of 3.    I first met him on 12/07/2012, at which time he complained of snoring, excessive daytime somnolence, non-restorative sleep, and disrupted sleep. I advised him to undergo a sleep study and he had a split-night sleep study on 12/25/2012. His baseline sleep efficiency was significantly reduced at 50.5% with a sleep latency of 42.5 minutes and wake after sleep onset of 78 minutes with severe sleep fragmentation noted. He had an increased percentage of stage I and stage II sleep and absence of slow-wave and REM sleep prior to CPAP. He had frequent PVCs and a brief run of tachycardia. He had severe periodic leg movements of sleep but with very little arousals. He had mild to moderate and at times loud snoring. His total AHI was 62.9 per hour. His baseline oxygen saturation was 91%, his nadir was 85%. He was then titrated on CPAP from 5-9 cm of water pressure and was briefly tried on BiPAP because of CPAP the emergent central apneas. He did not do well on BiPAP and switched back to CPAP of 9 cm and titrated to a final pressure of  10 cm with a reduction of the AHI to 6.8 per hour but no complete elimination of his sleep disordered breathing. I prescribed CPAP at 10 cm of water pressure and added a chin strap as he was noted to have significant mouth opening and oral sputtering. He did not tolerate a full face mask during the study.    I reviewed compliance data from 02/05/2013 through 03/06/2013 which is a total of 30 days, during which time he used CPAP every night except for one night. Percent used days greater than 4 hours was 73%, indicating adequate compliance. His pressure was 10 cm with EPR of 3. His leak was acceptable. However, his residual AHI  was high at 19.5 and based on this I faxed a CPAP pressure increase request to his DME company on 03/13/2013. His pressure was increased to 11 cm.    I reviewed compliance from 02/27/2013 to 03/28/2013 which is of total of 30 days during which time he uses CPAP every night. Percent used days greater than 4 hours was 93% indicating excellent compliance. His pressure is 11 cm with EPR of 3. His residual AHI is much improved to 4.8 per hour with an acceptable leak.    He had R shoulder replacement on 02/13/13, and had PT.    His Past Medical History Is Significant For: Past Medical History  Diagnosis Date  . Hypercholesteremia   . Morbid obesity   . Irritable bowel syndrome   . Pain in limb   . Obstructive sleep apnea (adult) (pediatric)   . Snoring 12/07/2012  . Hyperlipidemia     takes Vytorin daily  . HTN (hypertension)     takes Ramipril daily  . Shortness of breath     with exertion  . Pneumonia     hx of;about 21yr ago  . Headache(784.0)     occasionally  . Arthritis   . Joint pain   . Peripheral edema     takes Furosemide daily  . History of gout   . Psoriasis   . GERD (gastroesophageal reflux disease)     but doesn't take any meds  . Hemorrhoids   . Constipation   . IBS (irritable bowel syndrome)   . Depression     but no meds required  . Insomnia     doesn't require meds  . DM (diabetes mellitus)     takes Victoza daily;Humalog and Levemir daily  . Osteoarthritis of right shoulder region 02/13/2013    His Past Surgical History Is Significant For: Past Surgical History  Procedure Laterality Date  . Knee surgery Bilateral   . Ankle surgery Right   . Colonoscopy    . Esophagogastroduodenoscopy    . 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 Total shoulder arthroplasty Right 02/13/2013    Procedure: TOTAL SHOULDER ARTHROPLASTY;  Surgeon: JJohnny Bridge MD;  [facility]  Service: Orthopedics;  Laterality: Right;    His Family History Is  Significant For: Family History  Problem Relation Age of Onset  . Diabetes Mother     His Social History Is Significant For: History   Social History  . Marital Status: Married    Spouse Name: N/A    Number of Children: N/A  . Years of Education: N/A   Social History Main Topics  . Smoking status: Never Smoker   . Smokeless tobacco: Never Used  . Alcohol Use: 0.0 oz/week     Comment: occasionally beer  . Drug  Use: No  . Sexual Activity: Yes   Other Topics Concern  . None   Social History Narrative    His Allergies Are:  No Known Allergies:   His Current Medications Are:  Outpatient Encounter Prescriptions as of 03/27/2014  Medication Sig  . aspirin 81 MG tablet Take 81 mg by mouth daily.  . Choline Fenofibrate (FENOFIBRIC ACID) 135 MG CPDR Take 1 tablet by mouth daily.   . furosemide (LASIX) 40 MG tablet Take 40 mg by mouth daily.   . INVOKANA 300 MG TABS tablet Take 300 mg by mouth daily.  Marland Kitchen LEVEMIR FLEXTOUCH 100 UNIT/ML SOPN Inject 70 Units into the skin 2 (two) times daily.   . meloxicam (MOBIC) 15 MG tablet   . NOVOLOG FLEXPEN 100 UNIT/ML SOPN FlexPen Inject 16 Units into the skin daily. Takes before his biggest meal  . NOVOTWIST 32G X 5 MM MISC Pt uses 5 needles daily  . ONE TOUCH ULTRA TEST test strip   . POTASSIUM GLUCONATE PO Take 1 tablet by mouth daily.  . ramipril (ALTACE) 10 MG capsule Take 10 mg by mouth daily.   Marland Kitchen VICTOZA 18 MG/3ML SOPN Inject 1.8 mg into the skin daily.   Marland Kitchen VYTORIN 10-40 MG per tablet Take 1 tablet by mouth daily.   . [DISCONTINUED] Glucosamine Sulfate 1000 MG TABS Take 1 tablet by mouth daily.  . [DISCONTINUED] methocarbamol (ROBAXIN) 500 MG tablet Take 1 tablet (500 mg total) by mouth 4 (four) times daily. (Patient not taking: Reported on 03/27/2014)  . [DISCONTINUED] oxyCODONE-acetaminophen (PERCOCET) 10-325 MG per tablet Take 1-2 tablets by mouth every 6 (six) hours as needed for pain. MAXIMUM TOTAL ACETAMINOPHEN DOSE IS 4000 MG  PER DAY (Patient not taking: Reported on 03/27/2014)  . [DISCONTINUED] promethazine (PHENERGAN) 25 MG tablet Take 1 tablet (25 mg total) by mouth every 6 (six) hours as needed for nausea or vomiting. (Patient not taking: Reported on 03/27/2014)  . [DISCONTINUED] sennosides-docusate sodium (SENOKOT-S) 8.6-50 MG tablet Take 2 tablets by mouth daily. (Patient not taking: Reported on 03/27/2014)  :  Review of Systems:  Out of a complete 14 point review of systems, all are reviewed and negative with the exception of these symptoms as listed below:   Review of Systems  Neurological: Positive for headaches.    Objective:  Neurologic Exam  Physical Exam Physical Examination:   Filed Vitals:   03/27/14 1120  BP: 116/70  Pulse: 65  Temp: 98.6 F (37 C)   General Examination: The patient is a very pleasant 63 reports some weight loss and in fact has lost about 15 pounds since her last visit.  HEENT: Normocephalic, atraumatic, pupils are equal, round and reactive to light and accommodation. Funduscopic exam is normal with sharp disc margins noted. Extraocular tracking is good without limitation to gaze excursion or nystagmus noted. Normal smooth pursuit is noted. Hearing is grossly intact. Face is symmetric with normal facial animation and normal facial sensation. Speech is clear with no dysarthria noted. There is no hypophonia. There is no lip, neck/head, jaw or voice tremor. Neck is supple with full range of passive and active motion. There are no carotid bruits on auscultation. Oropharynx exam reveals: mild mouth dryness, mild pharyngeal erythema, adequate dental hygiene and moderate airway crowding, due to elongated uvula, redundant soft palate, larger tongue and tonsillar size of 1+. Mallampati is class II. Tongue protrudes centrally and palate elevates symmetrically.   Chest: Clear to auscultation without wheezing, rhonchi or crackles noted.  Heart:  S1+S2+0, regular and normal without  murmurs, rubs or gallops noted.   Abdomen: Soft, non-tender and non-distended with normal bowel sounds appreciated on auscultation.  Extremities: There is no pitting edema in the distal lower extremities bilaterally. Pedal pulses are intact. He has very mild limitation in R shoulder ROM and mild right shoulder pain.  Skin: Warm and dry without trophic changes noted. There are no varicose veins.  Musculoskeletal: exam reveals no obvious joint deformities, tenderness or joint swelling or erythema. He reports mild bilateral knee pain.   Neurologically:  Mental status: The patient is awake, alert and oriented in all 4 spheres. His memory, attention, language and knowledge are appropriate. There is no aphasia, agnosia, apraxia or anomia. Speech is clear with normal prosody and enunciation. Thought process is linear. Mood is congruent and affect is normal.  Cranial nerves are as described above under HEENT exam. In addition, shoulder shrug is normal with equal shoulder height noted. Motor exam: Normal bulk, strength and tone is noted. He has mild pain in both knees. There is no drift, tremor or rebound. Romberg is negative. Reflexes are 1+ throughout. Fine motor skills are intact with normal finger taps, normal hand movements, normal rapid alternating patting, normal foot taps and normal foot agility.  Cerebellar testing shows no dysmetria or intention tremor on finger to nose testing. Heel to shin is unremarkable bilaterally. There is no truncal or gait ataxia.  Sensory exam is intact to light touch, pinprick, vibration, temperature sense in the upper and lower extremities.  Gait, station and balance are unremarkable. No veering to one side is noted. No leaning to one side is noted. Posture is age-appropriate and stance is narrow based. No problems turning are noted. He turns en bloc. Tandem walk is unremarkable.   Assessment and Plan:   In summary, BUELL PARCEL is a very pleasant 63 year old male  with an underlying medical history of hypertension, diabetes, hyperlipidemia, irritable bowel syndrome, chronic kidney disease and obesity, who presents for followup consultation of his severe obstructive sleep apnea, established on CPAP for the past year, with good compliance. He reports doing well with regards to his sleep. His exam is stable. I reiterated his split-night sleep study findings from September 2014 and we looked at his most recent compliance data. His leak was elevated. He may need new supplies and I renewed his order for CPAP supplies. He is congratulated on his weight loss. He is reminded not to skip any days and when he was out of town recently did not take his machine but he also did not feel completely well from the congestion and cold standpoint. I reiterated the importance of using CPAP regularly, especially in light of his other cardiovascular risk factors. He is advised about his sleep study results again and the importance of using CPAP as a means to improve his symptoms but also his cardiovascular risk factor profile and to help his memory function.  I think, at this point I can see him back on a yearly basis. I answered all their questions today and the patient and his wife were in agreement. He is encouraged them to call with any interim questions, concerns, problems or updates.

## 2014-03-27 NOTE — Patient Instructions (Signed)
Please continue using your CPAP regularly. While your insurance requires that you use CPAP at least 4 hours each night on 70% of the nights, I recommend, that you not skip any nights and use it throughout the night if you can. Getting used to CPAP and staying with the treatment long term does take time and patience and discipline. Untreated obstructive sleep apnea when it is moderate to severe can have an adverse impact on cardiovascular health and raise her risk for heart disease, arrhythmias, hypertension, congestive heart failure, stroke and diabetes. Untreated obstructive sleep apnea causes sleep disruption, nonrestorative sleep, and sleep deprivation. This can have an impact on your day to day functioning and cause daytime sleepiness and impairment of cognitive function, memory loss, mood disturbance, and problems focussing. Using CPAP regularly can improve these symptoms. Keep up the good work! I will see you back in 12 months.    

## 2014-11-06 IMAGING — CR DG CHEST 2V
2 series · 2 of 2 positions shown · non-contrast
Comparison: No priors.

CLINICAL DATA: Pre-admission film prior to right shoulder
replacement.

EXAM:
CHEST  2 VIEW

[w chest pa]
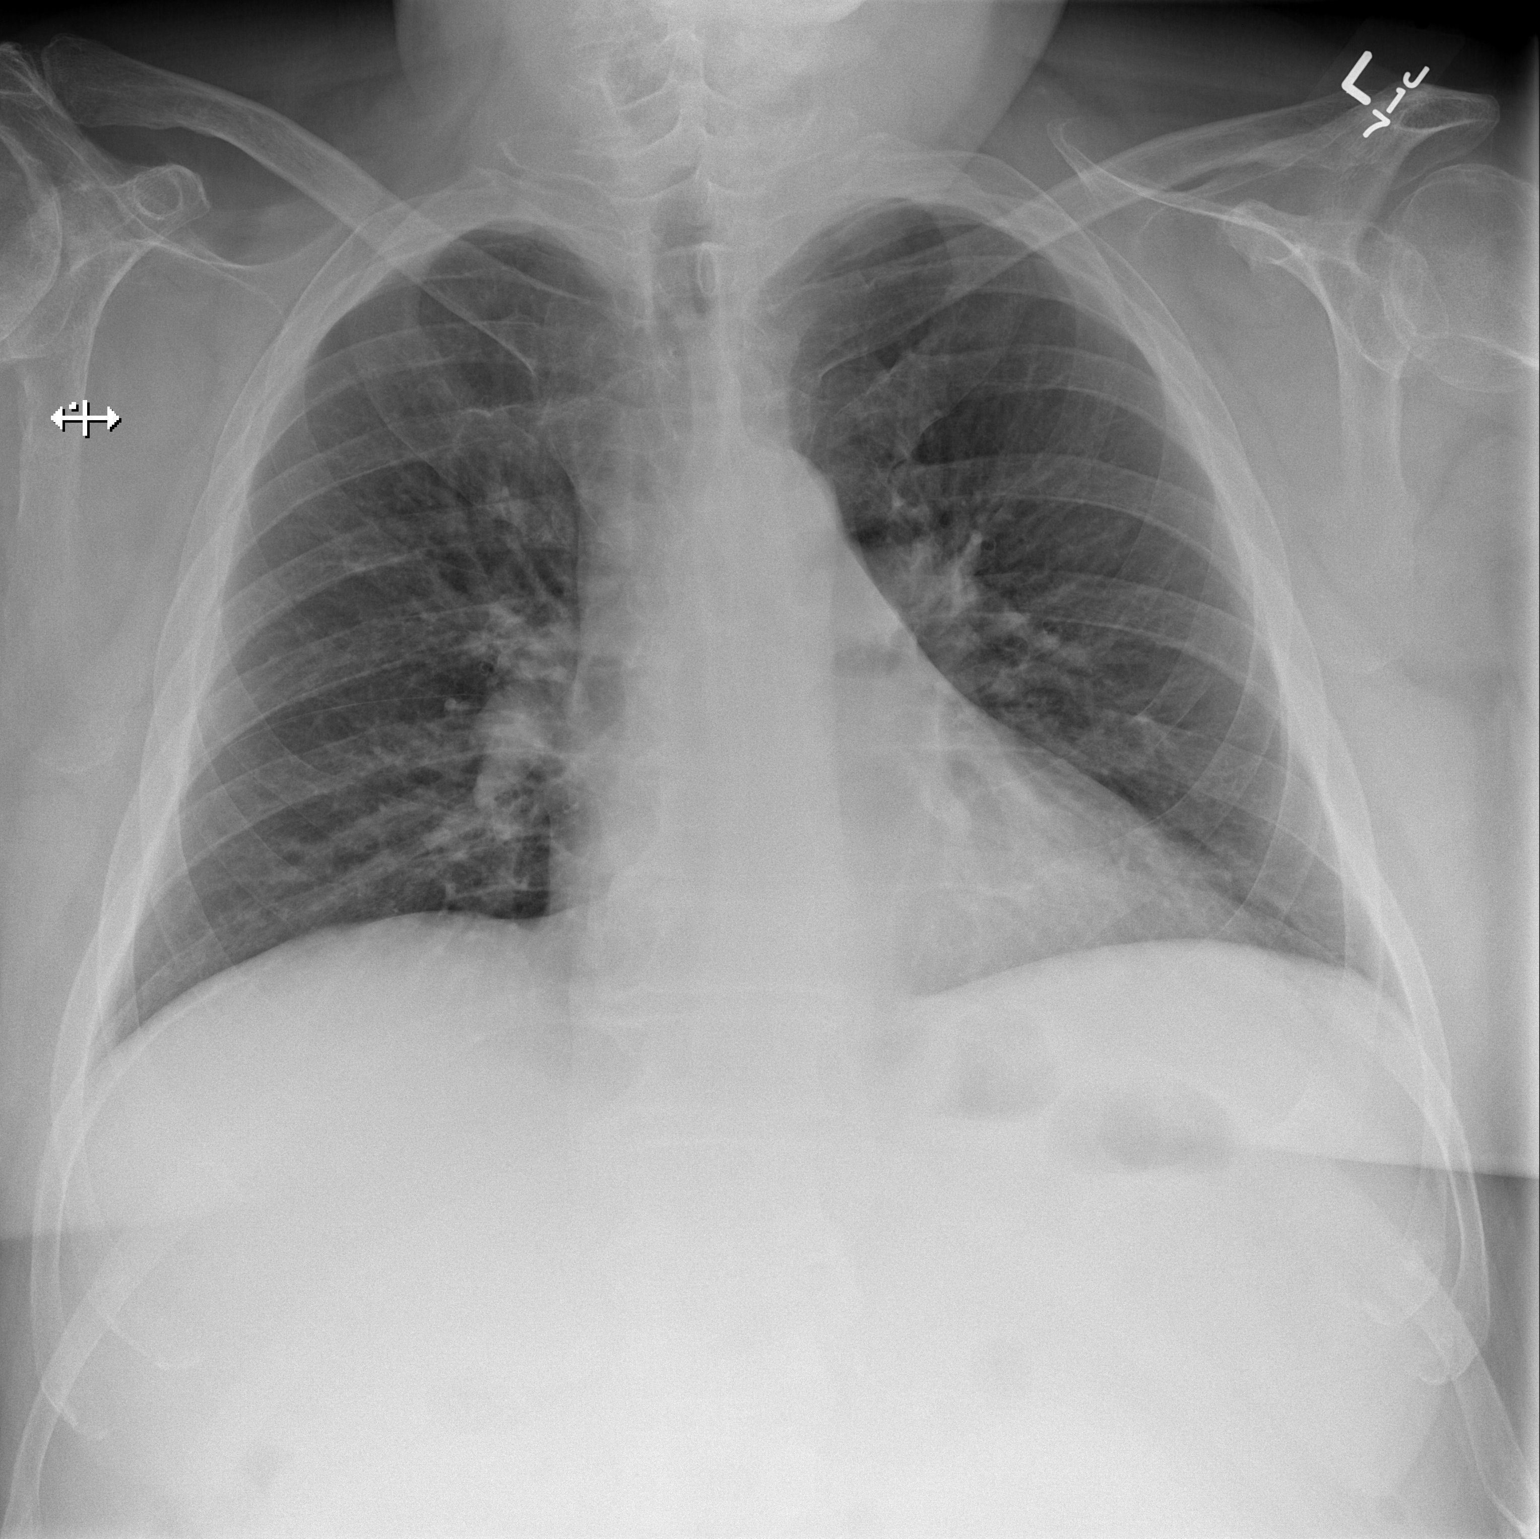

[w chest lat]
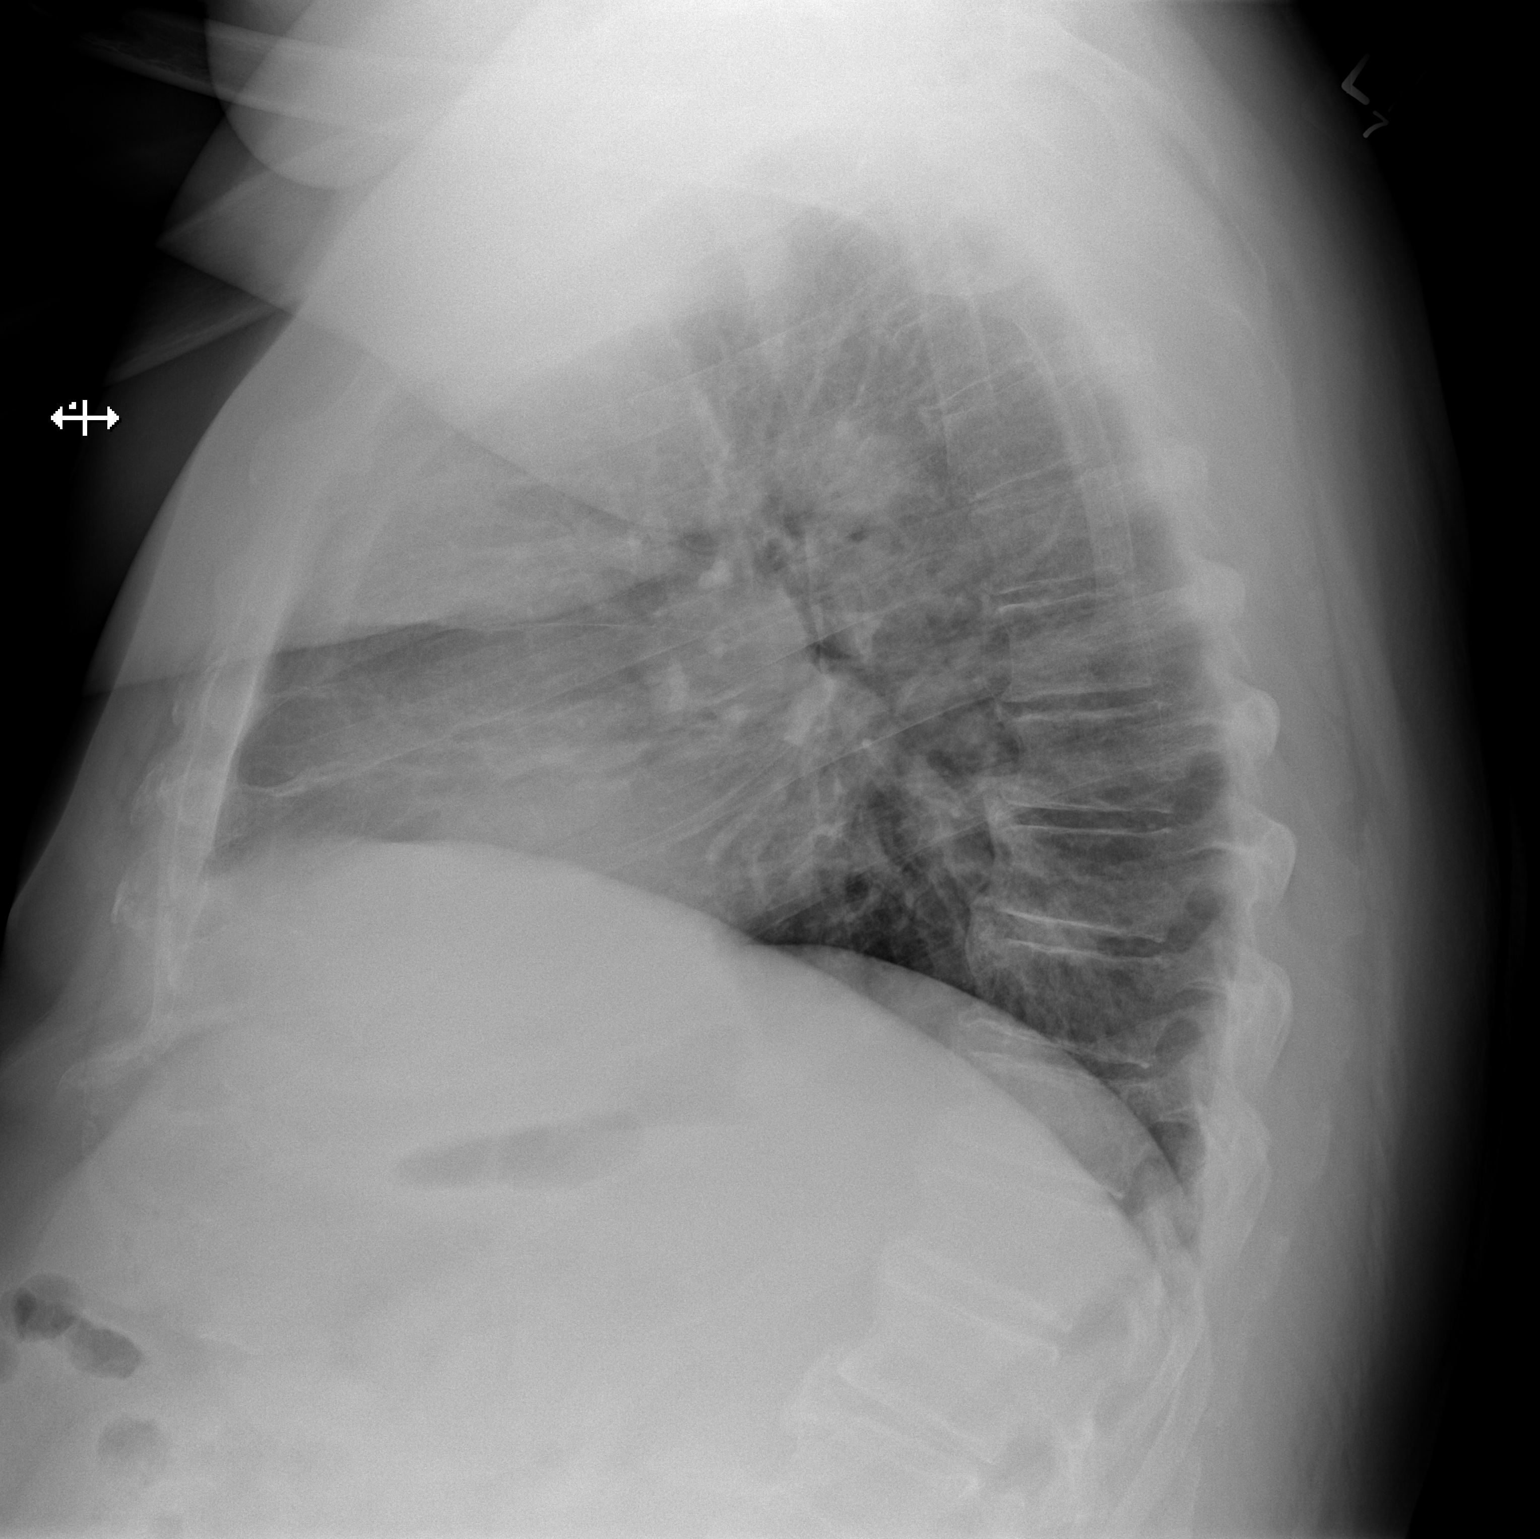

[2 of 2 positions shown; findings below may reference images not displayed]

FINDINGS: Lung volumes are normal. No consolidative airspace disease. No
pleural effusions. No pneumothorax. No pulmonary nodule or mass
noted. Pulmonary vasculature and the cardiomediastinal silhouette
are within normal limits.
IMPRESSION: 1.  No radiographic evidence of acute cardiopulmonary disease.

## 2014-11-14 IMAGING — CR DG SHOULDER 2+V*R*
1 series · 1 of 1 positions shown · non-contrast
Comparison: None.

CLINICAL DATA: Postop right shoulder

EXAM:
RIGHT SHOULDER - 2+ VIEW

[AP]
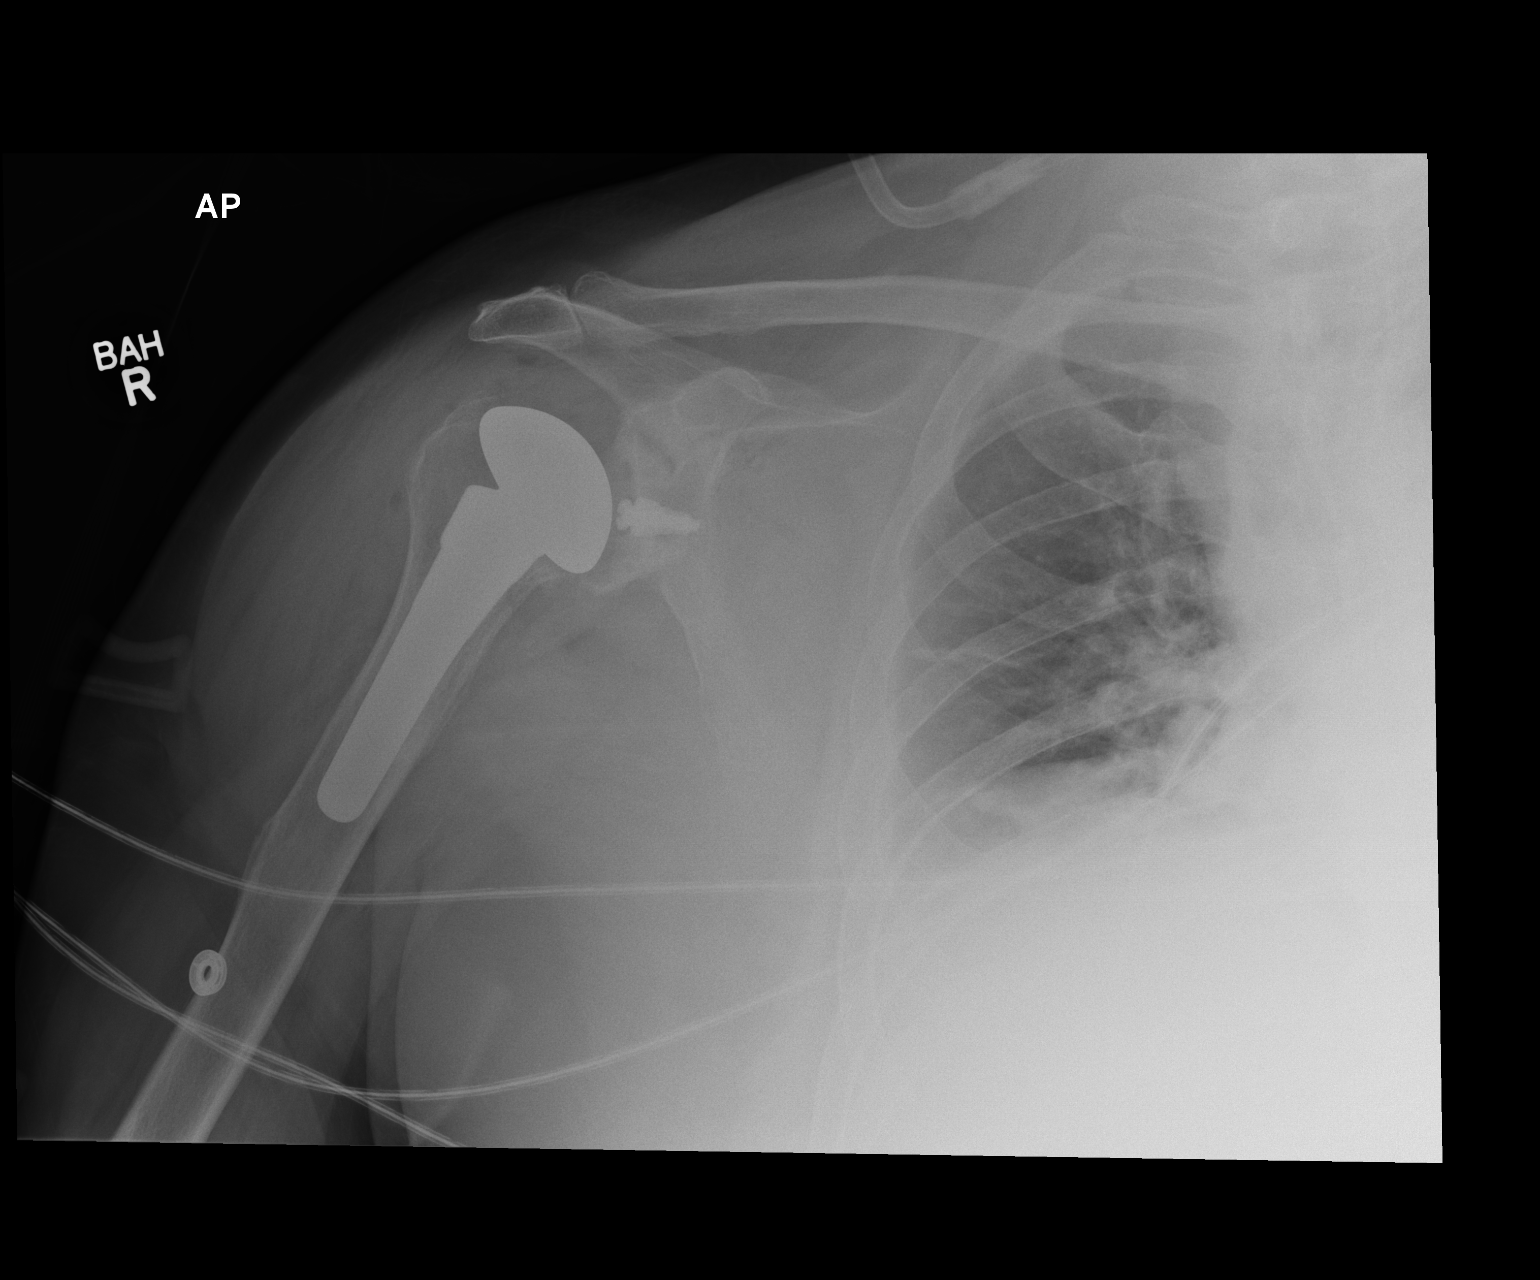

[1 of 1 positions shown; findings below may reference images not displayed]

FINDINGS: There is interval placement of a right shoulder arthroplasty without
hardware failure or complication. There is no obvious dislocation on
a single view. There is no fracture. There are postsurgical changes
in the surrounding soft tissues. There are mild degenerative changes
of the acromioclavicular joint.
IMPRESSION: Right shoulder arthroplasty.

## 2015-03-28 ENCOUNTER — Ambulatory Visit: Payer: BC Managed Care – PPO | Admitting: Neurology

## 2015-04-08 ENCOUNTER — Ambulatory Visit: Payer: BC Managed Care – PPO | Admitting: Neurology

## 2015-04-15 ENCOUNTER — Telehealth: Payer: Self-pay

## 2015-04-15 NOTE — Telephone Encounter (Signed)
Left message to bring in CPAP or SD card to [facility] tomorrow so we can get recent CPAP download.

## 2015-04-16 ENCOUNTER — Ambulatory Visit (INDEPENDENT_AMBULATORY_CARE_PROVIDER_SITE_OTHER): Payer: Medicare Other | Admitting: Neurology

## 2015-04-16 ENCOUNTER — Encounter: Payer: Self-pay | Admitting: Neurology

## 2015-04-16 VITALS — BP 120/68 | HR 70 | Resp 16 | Ht 70.0 in | Wt 265.0 lb

## 2015-04-16 DIAGNOSIS — E669 Obesity, unspecified: Secondary | ICD-10-CM | POA: Diagnosis not present

## 2015-04-16 DIAGNOSIS — G4733 Obstructive sleep apnea (adult) (pediatric): Secondary | ICD-10-CM | POA: Diagnosis not present

## 2015-04-16 DIAGNOSIS — Z9989 Dependence on other enabling machines and devices: Principal | ICD-10-CM

## 2015-04-16 NOTE — Patient Instructions (Addendum)
Keep up the good work! I will see you back in 12 months for sleep apnea check up.  Please continue using your CPAP regularly. While your insurance requires that you use CPAP at least 4 hours each night on 70% of the nights, I recommend, that you not skip any nights and use it throughout the night if you can. Getting used to CPAP and staying with the treatment long term does take time and patience and discipline. Untreated obstructive sleep apnea when it is moderate to severe can have an adverse impact on cardiovascular health and raise her risk for heart disease, arrhythmias, hypertension, congestive heart failure, stroke and diabetes. Untreated obstructive sleep apnea causes sleep disruption, nonrestorative sleep, and sleep deprivation. This can have an impact on your day to day functioning and cause daytime sleepiness and impairment of cognitive function, memory loss, mood disturbance, and problems focussing. Using CPAP regularly can improve these symptoms.   

## 2015-04-16 NOTE — Progress Notes (Signed)
Subjective:    Patient ID: Jamie Figueroa is a 65 y.o. male.  HPI     Interim history:   Jamie Figueroa is a very pleasant 65 year old right-handed gentleman with an underlying medical history of hypertension, diabetes, hyperlipidemia, irritable bowel syndrome, chronic kidney disease and obesity, who presents for followup consultation of his obstructive sleep apnea. He is accompanied by his wife today. I last saw him on 03/27/2014, at which time he reported doing fairly well with CPAP therapy. He felt stable memory wise. His diabetes was under control and he was on a new medication. He had knee pain bilaterally and was status post recent steroid injections under Dr. Mardelle Matte. He was compliant with treatment.  Today, 04/16/2015: I reviewed his CPAP compliance data from 01/20/2015 through 02/18/2015 which is a total of 30 days during which time he used his machine 29 days with percent used days greater than 4 hours at 77%, indicating adequate compliance with an average usage of 5 hours and 2 minutes, residual AHI 3.5 per hour, leak at times high with the 95th percentile at 39.4 L/m on a pressure of 11 cm with EPR of 3.  Today, 04/16/2015: He reports doing okay, had recent supplies sent to him, but at one point, had small pillows, instead of medium. He has L shoulder pain, is s/p R shoulder replacement in 11/14, had b/l arthroscopic knee surgeries. May need knee replacement and eventually L shoulder surgery too. He has been generally speaking compliant with CPAP treatment. He had some lapses in treatment when they were out of town. He has lost a little bit of weight. Compared to last year around this time he has lost about 10 pounds. He has difficulty using CPAP when he is congested. He suffers from recurrent allergy symptoms.  Previously:   I saw him on 09/25/13, at which time he was having difficulty with his mask. He needed new supplies. He was also taking his mask off in the middle of the night without  realizing it. Sometimes in the morning hours he would not put his mask back on. He was sleepy during the day.   I reviewed his compliance data from 02/23/14 to 03/24/14, which is a total of 30 days, during which time he used his machine 26 days, with percent used days greater than 4 hours of 77%, indicating adequate compliance. Average usage of 5 hours and 12 minutes for all nights. Residual AHI at 2.9 per hour indicating an appropriate treatment pressure of 11 cm with EPR of 3. Leak at times was high with the 95th percentile at 31.7 L/m.  I saw him on 03/29/13, at which time he reported doing well and was compliant with CPAP therapy. I reviewed his compliance data from 08/21/13 to 09/19/13, which is a total of 30 days, during which time he used CPAP regularly except 5 night, his average usage for all days was 4 hours and 14 minutes, and percent used days >4 hours was 63%, indicating suboptimal compliance. Residual AHI was elevated at 8.1/h (mostly obstructive) and leak was at times high, with 95th percentile at 37.5 lpm, at the setting of 11 cm and EPR of 3.   I first met him on 12/07/2012, at which time he complained of snoring, excessive daytime somnolence, non-restorative sleep, and disrupted sleep. I advised him to undergo a sleep study and he had a split-night sleep study on 12/25/2012. His baseline sleep efficiency was significantly reduced at 50.5% with a sleep latency of  42.5 minutes and wake after sleep onset of 78 minutes with severe sleep fragmentation noted. He had an increased percentage of stage I and stage II sleep and absence of slow-wave and REM sleep prior to CPAP. He had frequent PVCs and a brief run of tachycardia. He had severe periodic leg movements of sleep but with very little arousals. He had mild to moderate and at times loud snoring. His total AHI was 62.9 per hour. His baseline oxygen saturation was 91%, his nadir was 85%. He was then titrated on CPAP from 5-9 cm of water pressure  and was briefly tried on BiPAP because of CPAP the emergent central apneas. He did not do well on BiPAP and switched back to CPAP of 9 cm and titrated to a final pressure of 10 cm with a reduction of the AHI to 6.8 per hour but no complete elimination of his sleep disordered breathing. I prescribed CPAP at 10 cm of water pressure and added a chin strap as he was noted to have significant mouth opening and oral sputtering. He did not tolerate a full face mask during the study.    I reviewed compliance data from 02/05/2013 through 03/06/2013 which is a total of 30 days, during which time he used CPAP every night except for one night. Percent used days greater than 4 hours was 73%, indicating adequate compliance. His pressure was 10 cm with EPR of 3. His leak was acceptable. However, his residual AHI was high at 19.5 and based on this I faxed a CPAP pressure increase request to his DME company on 03/13/2013. His pressure was increased to 11 cm.    I reviewed compliance from 02/27/2013 to 03/28/2013 which is of total of 30 days during which time he uses CPAP every night. Percent used days greater than 4 hours was 93% indicating excellent compliance. His pressure is 11 cm with EPR of 3. His residual AHI is much improved to 4.8 per hour with an acceptable leak.    He had R shoulder replacement on 02/13/13, and had PT.    His Past Medical History Is Significant For: Past Medical History  Diagnosis Date  . Hypercholesteremia   . Morbid obesity ([place])   . Irritable bowel syndrome   . Pain in limb   . Obstructive sleep apnea (adult) (pediatric)   . Snoring 12/07/2012  . Hyperlipidemia     takes Vytorin daily  . HTN (hypertension)     takes Ramipril daily  . Shortness of breath     with exertion  . Pneumonia     hx of;about 7yr ago  . Headache(784.0)     occasionally  . Arthritis   . Joint pain   . Peripheral edema     takes Furosemide daily  . History of gout   . Psoriasis   . GERD  (gastroesophageal reflux disease)     but doesn't take any meds  . Hemorrhoids   . Constipation   . IBS (irritable bowel syndrome)   . Depression     but no meds required  . Insomnia     doesn't require meds  . DM (diabetes mellitus) ([facility] [facility]     takes Victoza daily;Humalog and Levemir daily  . Osteoarthritis of right shoulder region 02/13/2013    His Past Surgical History Is Significant For: Past Surgical History  Procedure Laterality Date  . Knee surgery Bilateral   . Ankle surgery Right   . Colonoscopy    . Esophagogastroduodenoscopy    .  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 Total shoulder arthroplasty Right 02/13/2013    Procedure: TOTAL SHOULDER ARTHROPLASTY;  Surgeon: Jamie Bridge, MD;  Location: [facility];  Service: Orthopedics;  Laterality: Right;    His Family History Is Significant For: Family History  Problem Relation Age of Onset  . Diabetes Mother     His Social History Is Significant For: Social History   Social History  . Marital Status: Married    Spouse Name: N/A  . Number of Children: N/A  . Years of Education: N/A   Social History Main Topics  . Smoking status: Never Smoker   . Smokeless tobacco: Never Used  . Alcohol Use: 0.0 oz/week     Comment: occasionally beer  . Drug Use: No  . Sexual Activity: Yes   Other Topics Concern  . None   Social History Narrative    His Allergies Are:  No Known Allergies:   His Current Medications Are:  Outpatient Encounter Prescriptions as of 04/16/2015  Medication Sig  . aspirin 81 MG tablet Take 81 mg by mouth daily.  . Choline Fenofibrate (FENOFIBRIC ACID) 135 MG CPDR Take 1 tablet by mouth daily.   . furosemide (LASIX) 40 MG tablet Take 40 mg by mouth daily.   . INVOKANA 300 MG TABS tablet Take 300 mg by mouth daily.  Marland Kitchen LEVEMIR FLEXTOUCH 100 UNIT/ML SOPN Inject 50 Units into the skin 2 (two) times daily.   . meloxicam (MOBIC) 15 MG tablet   . NOVOLOG FLEXPEN 100 UNIT/ML SOPN  FlexPen Inject 16 Units into the skin daily. Takes before his biggest meal  . NOVOTWIST 32G X 5 MM MISC Pt uses 5 needles daily  . ONE TOUCH ULTRA TEST test strip   . POTASSIUM GLUCONATE PO Take 1 tablet by mouth daily.  . ramipril (ALTACE) 10 MG capsule Take 10 mg by mouth daily.   Marland Kitchen VICTOZA 18 MG/3ML SOPN Inject 1.8 mg into the skin daily.   Marland Kitchen VYTORIN 10-40 MG per tablet Take 1 tablet by mouth daily.    No facility-administered encounter medications on file as of 04/16/2015.  :  Review of Systems:  Out of a complete 14 point review of systems, all are reviewed and negative with the exception of these symptoms as listed below:   Review of Systems  Neurological:       Patient is here for f/u. No new concerns.     Objective:  Neurologic Exam  Physical Exam Physical Examination:   Filed Vitals:   04/16/15 1140  BP: 120/68  Pulse: 70  Resp: 16   General Examination: The patient is a very pleasant 65 yo gentleman in NAD. He reports some weight loss and in fact has lost about 10 pounds since last visit.  HEENT: Normocephalic, atraumatic, pupils are equal, round and reactive to light and accommodation. Extraocular tracking is good without limitation to gaze excursion or nystagmus noted. Normal smooth pursuit is noted. Hearing is grossly intact. Face is symmetric with normal facial animation and normal facial sensation. Speech is clear with no dysarthria noted. There is no hypophonia. There is no lip, neck/head, jaw or voice tremor. Neck is supple with full range of passive and active motion. There are no carotid bruits on auscultation. Oropharynx exam reveals: mild mouth dryness, mild pharyngeal erythema, adequate dental hygiene and moderate airway crowding, due to elongated uvula, redundant soft palate, larger tongue and tonsillar size of 1+. Mallampati is class II. Tongue  protrudes centrally and palate elevates symmetrically.   Chest: Clear to auscultation without wheezing, rhonchi or  crackles noted.  Heart: S1+S2+0, regular and normal without murmurs, rubs or gallops noted.   Abdomen: Soft, non-tender and non-distended with normal bowel sounds appreciated on auscultation.  Extremities: There is no pitting edema in the distal lower extremities bilaterally. Pedal pulses are intact. He has very mild limitation in R shoulder ROM and mild right shoulder pain.  Skin: Warm and dry without trophic changes noted. There are no varicose veins.  Musculoskeletal: exam reveals mild bilateral knee pain and decreased range of motion in his left shoulder.   Neurologically:  Mental status: The patient is awake, alert and oriented in all 4 spheres. His memory, attention, language and knowledge are appropriate. There is no aphasia, agnosia, apraxia or anomia. Speech is clear with normal prosody and enunciation. Thought process is linear. Mood is congruent and affect is normal.  Cranial nerves are as described above under HEENT exam. In addition, shoulder shrug is normal with equal shoulder height noted. Motor exam: Normal bulk, strength and tone is noted. He has mild pain in both knees and limitation with the L shoulder. There is no drift, tremor or rebound. Romberg is negative. Reflexes are 1+ throughout. Fine motor skills are intact with normal finger taps, normal hand movements, normal rapid alternating patting, normal foot taps and normal foot agility.  Cerebellar testing shows no dysmetria or intention tremor on finger to nose testing. Heel to shin is unremarkable bilaterally. There is no truncal or gait ataxia.  Sensory exam is intact to light touch in the upper and lower extremities.  Gait, station and balance are unremarkable. No veering to one side is noted. No leaning to one side is noted. Posture is age-appropriate and stance is narrow based. No problems turning are noted. He turns en bloc. Tandem walk is unremarkable.   Assessment and Plan:   In summary, ANIBAL QUINBY is a very  pleasant 64 year old male with an underlying medical history of hypertension, diabetes, hyperlipidemia, irritable bowel syndrome, chronic kidney disease and obesity, who presents for followup consultation of his severe obstructive sleep apnea, established on CPAP for the past couple of years, with good compliance, generally speaking. He endorsed improved sleep. His physical exam is stable with the exception of worsening left shoulder issues and he has also been able to lose some weight which is favorable. He had a split-night sleep study in September 2014 and we reviewed his most recent compliance data as well. He has received new supplies. His leak has been high but this may be in the context of ill fitting nasal pillows recently and also he received a new head gear which he has not yet started using. I reiterated the importance of using CPAP regularly, especially in light of his other cardiovascular risk factors. He is advised about his sleep study results again and the importance of using CPAP as a means to improve his symptoms but also his cardiovascular risk factor profile and to help his memory function. I will see him back routinely in one year for sleep apnea follow-up. I answered all her questions today and the patient and his wife were in agreement. I spent 20 minutes in total face-to-face time with the patient, more than 50% of which was spent in counseling and coordination of care, reviewing test results, reviewing medication and discussing or reviewing the diagnosis of OSA, its prognosis and treatment options.

## 2015-08-11 ENCOUNTER — Encounter: Payer: Self-pay | Admitting: Gastroenterology

## 2015-10-07 ENCOUNTER — Ambulatory Visit (AMBULATORY_SURGERY_CENTER): Payer: Self-pay | Admitting: *Deleted

## 2015-10-07 VITALS — Ht 70.0 in | Wt 270.0 lb

## 2015-10-07 DIAGNOSIS — Z86010 Personal history of colonic polyps: Secondary | ICD-10-CM

## 2015-10-07 MED ORDER — NA SULFATE-K SULFATE-MG SULF 17.5-3.13-1.6 GM/177ML PO SOLN: 1.0000 | Freq: Once | ORAL | Status: DC

## 2015-10-07 NOTE — Progress Notes (Signed)
No egg or soy allergy known to patient  No issues with past sedation with any surgeries  or procedures, no intubation problems  No diet pills per patient No home 02 use per patient  No blood thinners per patient  Pt denies issues with constipation  emmi video to e mail  

## 2015-10-09 ENCOUNTER — Encounter: Payer: Self-pay | Admitting: Gastroenterology

## 2015-10-21 ENCOUNTER — Encounter: Payer: Self-pay | Admitting: Gastroenterology

## 2015-10-21 ENCOUNTER — Ambulatory Visit (AMBULATORY_SURGERY_CENTER): Payer: Medicare Other | Admitting: Gastroenterology

## 2015-10-21 VITALS — BP 132/66 | HR 59 | Temp 97.3°F | Resp 12 | Ht 70.0 in | Wt 270.0 lb

## 2015-10-21 DIAGNOSIS — Z86010 Personal history of colonic polyps: Secondary | ICD-10-CM

## 2015-10-21 DIAGNOSIS — R195 Other fecal abnormalities: Secondary | ICD-10-CM

## 2015-10-21 DIAGNOSIS — K633 Ulcer of intestine: Secondary | ICD-10-CM

## 2015-10-21 DIAGNOSIS — D124 Benign neoplasm of descending colon: Secondary | ICD-10-CM

## 2015-10-21 DIAGNOSIS — K598 Other specified functional intestinal disorders: Secondary | ICD-10-CM | POA: Diagnosis not present

## 2015-10-21 LAB — GLUCOSE, CAPILLARY
GLUCOSE-CAPILLARY: 88 mg/dL (ref 65–99)
Glucose-Capillary: 98 mg/dL (ref 65–99)

## 2015-10-21 MED ORDER — SODIUM CHLORIDE 0.9 % IV SOLN: 500.0000 mL | INTRAVENOUS | Status: DC

## 2015-10-21 NOTE — Op Note (Signed)
[facility] Patient Name: Geonni Stimpert Procedure Date: 10/21/2015 8:31 AM MRN: [id number] Endoscopist: Ladene Artist , MD Age: 65 Referring MD:  Date of Birth: [birth year] Gender: Male Account #: [id number] Procedure:                Colonoscopy Indications:              Evaluation of unexplained GI bleeding, Positive                            fecal immunochemical test Medicines:                Monitored Anesthesia Care Procedure:                Pre-Anesthesia Assessment:                           - Prior to the procedure, a History and Physical                            was performed, and patient medications and                            allergies were reviewed. The patient's tolerance of                            previous anesthesia was also reviewed. The risks                            and benefits of the procedure and the sedation                            options and risks were discussed with the patient.                            All questions were answered, and informed consent                            was obtained. Prior Anticoagulants: The patient has                            taken no previous anticoagulant or antiplatelet                            agents. ASA Grade Assessment: III - A patient with                            severe systemic disease. After reviewing the risks                            and benefits, the patient was deemed in                            satisfactory condition to undergo the procedure.  After obtaining informed consent, the colonoscope                            was passed under direct vision. Throughout the                            procedure, the patient's blood pressure, pulse, and                            oxygen saturations were monitored continuously. The                            Model PCF-H190L [id number]) scope was introduced                            through the anus and advanced to  the the cecum,                            identified by appendiceal orifice and ileocecal                            valve. The colonoscopy was performed without                            difficulty. The patient tolerated the procedure                            well. The quality of the bowel preparation was                            good. The ileocecal valve, appendiceal orifice, and                            rectum were photographed. Scope In: 8:47:46 AM Scope Out: 8:58:29 AM Total Procedure Duration: 0 hours 10 minutes 43 seconds  Findings:                 The digital rectal exam was normal.                           A 6 mm polyp was found in the descending colon. The                            polyp was sessile. The polyp was removed with a                            cold snare. Resection and retrieval were complete.                           A few 4-6 mm benign appearing ulcers were found in                            the transverse colon. No bleeding was present. No  stigmata of recent bleeding were seen. Biopsies                            were taken with a cold forceps for histology.                           A single small angiodysplastic lesion without                            bleeding was found in the sigmoid colon.                           The exam was otherwise normal throughout the                            examined colon.                           The retroflexed view of the distal rectum and anal                            verge was normal and showed no anal or rectal                            abnormalities. Complications:            No immediate complications. Estimated Blood Loss:     Estimated blood loss: none. Impression:               - One 6 mm polyp in the descending colon, removed                            with a cold snare. Resected and retrieved.                           - A few ulcers in the transverse colon. Biopsied.                            - A single non-bleeding colonic angiodysplastic                            lesion.                           - The distal rectum and anal verge are normal on                            retroflexion view. Recommendation:           - Patient has a contact number available for                            emergencies. The signs and symptoms of potential                            delayed complications were discussed with  the                            patient. Return to normal activities tomorrow.                            Written discharge instructions were provided to the                            patient.                           - Resume previous diet.                           - Continue present medications.                           - Await pathology results.                           - Repeat colonoscopy in 5 years for surveillance if                            polyp is precancerous, otherwise 10 years. Ladene Artist, MD 10/21/2015 9:04:35 AM This report has been signed electronically.

## 2015-10-21 NOTE — Progress Notes (Signed)
Stable to RR 

## 2015-10-21 NOTE — Patient Instructions (Signed)
YOU HAD AN ENDOSCOPIC PROCEDURE [facility]:   Refer to the procedure report that was given to you for any specific questions about what was found during the examination.  If the procedure report does not answer your questions, please call your gastroenterologist to clarify.  If you requested that your care partner not be given the details of your procedure findings, then the procedure report has been included in a sealed envelope for you to review at your convenience later.  YOU SHOULD EXPECT: Some feelings of bloating in the abdomen. Passage of more gas than usual.  Walking can help get rid of the air that was put into your GI tract during the procedure and reduce the bloating. If you had a lower endoscopy (such as a colonoscopy or flexible sigmoidoscopy) you may notice spotting of blood in your stool or on the toilet paper. If you underwent a bowel prep for your procedure, you may not have a normal bowel movement for a few days.  Please Note:  You might notice some irritation and congestion in your nose or some drainage.  This is from the oxygen used during your procedure.  There is no need for concern and it should clear up in a day or so.  SYMPTOMS TO REPORT IMMEDIATELY:   Following lower endoscopy (colonoscopy or flexible sigmoidoscopy):  Excessive amounts of blood in the stool  Significant tenderness or worsening of abdominal pains  Swelling of the abdomen that is new, acute  Fever of 100F or higher  For urgent or emergent issues, a gastroenterologist can be reached at any hour by calling [phone].  DIET: Your first meal following the procedure should be a small meal and then it is ok to progress to your normal diet. Heavy or fried foods are harder to digest and may make you feel nauseous or bloated.  Likewise, meals heavy in dairy and vegetables can increase bloating.  Drink plenty of fluids but you should avoid alcoholic beverages for 24 hours.  ACTIVITY:   You should plan to take it easy for the rest of today and you should NOT DRIVE or use heavy machinery until tomorrow (because of the sedation medicines used during the test).    FOLLOW UP: Our staff will call the number listed on your records the next business day following your procedure to check on you and address any questions or concerns that you may have regarding the information given to you following your procedure. If we do not reach you, we will leave a message.  However, if you are feeling well and you are not experiencing any problems, there is no need to return our call.  We will assume that you have returned to your regular daily activities without incident.  If any biopsies were taken you will be contacted by phone or by letter within the next 1-3 weeks.  Please call us at [phone] if you have not heard about the biopsies in 3 weeks.    SIGNATURES/CONFIDENTIALITY: You and/or your care partner have signed paperwork which will be entered into your electronic medical record.  These signatures attest to the fact that that the information above on your After Visit Summary has been reviewed and is understood.  Full responsibility of the confidentiality of this discharge information lies with you and/or your care-partner.  Await pathology  Continue your normal medications  Please read over handout about polyps 

## 2015-10-21 NOTE — Progress Notes (Signed)
Called to room to assist during endoscopic procedure.  Patient ID and intended procedure confirmed with present staff. Received instructions for my participation in the procedure from the performing physician.  

## 2015-10-22 ENCOUNTER — Telehealth: Payer: Self-pay

## 2015-10-22 NOTE — Telephone Encounter (Signed)
  Follow up Call-  Call back number 10/21/2015  Post procedure Call Back phone  # [phone]  Permission to leave phone message Yes     Patient questions:  Do you have a fever, pain , or abdominal swelling? No. Pain Score  0 *  Have you tolerated food without any problems? Yes.    Have you been able to return to your normal activities? Yes.    Do you have any questions about your discharge instructions: Diet   No. Medications  No. Follow up visit  No.  Do you have questions or concerns about your Care? No.  Actions: * If pain score is 4 or above: No action needed, pain <4.

## 2015-10-23 ENCOUNTER — Encounter: Payer: Self-pay | Admitting: Gastroenterology

## 2016-04-19 ENCOUNTER — Ambulatory Visit: Payer: Medicare Other | Admitting: Neurology

## 2016-04-19 ENCOUNTER — Telehealth: Payer: Self-pay

## 2016-04-19 NOTE — Telephone Encounter (Signed)
I spoke to wife and r/s appt from today to 1/24, provider out sick.

## 2016-05-05 ENCOUNTER — Ambulatory Visit (INDEPENDENT_AMBULATORY_CARE_PROVIDER_SITE_OTHER): Payer: Medicare Other | Admitting: Neurology

## 2016-05-05 ENCOUNTER — Encounter: Payer: Self-pay | Admitting: Neurology

## 2016-05-05 VITALS — BP 128/70 | HR 70 | Resp 20 | Ht 70.0 in | Wt 265.0 lb

## 2016-05-05 DIAGNOSIS — G4733 Obstructive sleep apnea (adult) (pediatric): Secondary | ICD-10-CM

## 2016-05-05 DIAGNOSIS — Z9989 Dependence on other enabling machines and devices: Secondary | ICD-10-CM | POA: Diagnosis not present

## 2016-05-05 NOTE — Patient Instructions (Signed)
Please continue using your CPAP regularly. While your insurance requires that you use CPAP at least 4 hours each night on 70% of the nights, I recommend, that you not skip any nights and use it throughout the night if you can. Getting used to CPAP and staying with the treatment long term does take time and patience and discipline. Untreated obstructive sleep apnea when it is moderate to severe can have an adverse impact on cardiovascular health and raise her risk for heart disease, arrhythmias, hypertension, congestive heart failure, stroke and diabetes. Untreated obstructive sleep apnea causes sleep disruption, nonrestorative sleep, and sleep deprivation. This can have an impact on your day to day functioning and cause daytime sleepiness and impairment of cognitive function, memory loss, mood disturbance, and problems focussing. Using CPAP regularly can improve these symptoms.  Keep up the good work! I will see you back in 1 year.  

## 2016-05-05 NOTE — Progress Notes (Signed)
Subjective:    Patient ID: Jamie Figueroa is a 66 y.o. male.  HPI     Interim history:   Jamie Figueroa is a very pleasant 66 year old right-handed gentleman with an underlying medical history of hypertension, diabetes, hyperlipidemia, irritable bowel syndrome, chronic kidney disease and obesity, who presents for followup consultation of his obstructive sleep apnea, on CPAP therapy at home. The patient is accompanied by his wife today. I last saw Jamie Figueroa on 04/16/2015, at which time Jamie Figueroa reported doing okay, Jamie Figueroa had been using CPAP with adequate compliance. Jamie Figueroa had lost a little bit of weight. Jamie Figueroa had lost about 10 pounds in a year. Jamie Figueroa did report difficulty with recurrent allergy symptoms and nasal congestion, making CPAP usage around that time difficult.   Today, 05/05/2016: I reviewed his CPAP compliance data from 04/05/2016 through 05/04/2016, which is a total of 30 days, during which time Jamie Figueroa used his machine 26 days with percent used days greater than 4 hours at 73%, indicating adequate compliance with an average usage of 5 hours and 18 minutes, residual AHI 12.2 per hour which is elevated, mostly obstructive in nature and leak elevated at 39.8 L/m on a pressure of 11 cm with EPR of 3.  Today, 05/05/2016: Jamie Figueroa reports doing okay, using nasal pillows. Was started recently on gabapentin 100 mg some 2 months ago per PCP. Has 6 monthly check ups, A1c has been good, less than 7, per wife. Fell in 2023/04/12 and broke his tailbone, still hurting some, but better. Will start a 6 month job, 3 days a week, wife is hoping, that will "get Jamie Figueroa off the couch!". Mother-in-law passed away in Apr 12, 2023.   Previously:   I saw Jamie Figueroa on 03/27/2014, at which time Jamie Figueroa reported doing fairly well with CPAP therapy. Jamie Figueroa felt stable memory wise. His diabetes was under control and Jamie Figueroa was on a new medication. Jamie Figueroa had knee pain bilaterally and was status post recent steroid injections under Dr. Mardelle Matte. Jamie Figueroa was compliant with treatment.   I reviewed  his CPAP compliance data from 01/20/2015 through 02/18/2015 which is a total of 30 days during which time Jamie Figueroa used his machine 29 days with percent used days greater than 4 hours at 77%, indicating adequate compliance with an average usage of 5 hours and 2 minutes, residual AHI 3.5 per hour, leak at times high with the 95th percentile at 39.4 L/m on a pressure of 11 cm with EPR of 3.   I saw Jamie Figueroa on 09/25/13, at which time Jamie Figueroa was having difficulty with his mask. Jamie Figueroa needed new supplies. Jamie Figueroa was also taking his mask off in the middle of the night without realizing it. Sometimes in the morning hours Jamie Figueroa would not put his mask back on. Jamie Figueroa was sleepy during the day.    I reviewed his compliance data from 02/23/14 to 03/24/14, which is a total of 30 days, during which time Jamie Figueroa used his machine 26 days, with percent used days greater than 4 hours of 77%, indicating adequate compliance. Average usage of 5 hours and 12 minutes for all nights. Residual AHI at 2.9 per hour indicating an appropriate treatment pressure of 11 cm with EPR of 3. Leak at times was high with the 95th percentile at 31.7 L/m.   I saw Jamie Figueroa on 03/29/13, at which time Jamie Figueroa reported doing well and was compliant with CPAP therapy. I reviewed his compliance data from 08/21/13 to 09/19/13, which is a total of 30 days, during which time Jamie Figueroa used  CPAP regularly except 5 night, his average usage for all days was 4 hours and 14 minutes, and percent used days >4 hours was 63%, indicating suboptimal compliance. Residual AHI was elevated at 8.1/h (mostly obstructive) and leak was at times high, with 95th percentile at 37.5 lpm, at the setting of 11 cm and EPR of 3.    I first met Jamie Figueroa on 12/07/2012, at which time Jamie Figueroa complained of snoring, excessive daytime somnolence, non-restorative sleep, and disrupted sleep. I advised Jamie Figueroa to undergo a sleep study and Jamie Figueroa had a split-night sleep study on 12/25/2012. His baseline sleep efficiency was significantly reduced at 50.5% with a  sleep latency of 42.5 minutes and wake after sleep onset of 78 minutes with severe sleep fragmentation noted. Jamie Figueroa had an increased percentage of stage I and stage II sleep and absence of slow-wave and REM sleep prior to CPAP. Jamie Figueroa had frequent PVCs and a brief run of tachycardia. Jamie Figueroa had severe periodic leg movements of sleep but with very little arousals. Jamie Figueroa had mild to moderate and at times loud snoring. His total AHI was 62.9 per hour. His baseline oxygen saturation was 91%, his nadir was 85%. Jamie Figueroa was then titrated on CPAP from 5-9 cm of water pressure and was briefly tried on BiPAP because of CPAP the emergent central apneas. Jamie Figueroa did not do well on BiPAP and switched back to CPAP of 9 cm and titrated to a final pressure of 10 cm with a reduction of the AHI to 6.8 per hour but no complete elimination of his sleep disordered breathing. I prescribed CPAP at 10 cm of water pressure and added a chin strap as Jamie Figueroa was noted to have significant mouth opening and oral sputtering. Jamie Figueroa did not tolerate a full face mask during the study.     I reviewed compliance data from 02/05/2013 through 03/06/2013 which is a total of 30 days, during which time Jamie Figueroa used CPAP every night except for one night. Percent used days greater than 4 hours was 73%, indicating adequate compliance. His pressure was 10 cm with EPR of 3. His leak was acceptable. However, his residual AHI was high at 19.5 and based on this I faxed a CPAP pressure increase request to his DME company on 03/13/2013. His pressure was increased to 11 cm.     I reviewed compliance from 02/27/2013 to 03/28/2013 which is of total of 30 days during which time Jamie Figueroa uses CPAP every night. Percent used days greater than 4 hours was 93% indicating excellent compliance. His pressure is 11 cm with EPR of 3. His residual AHI is much improved to 4.8 per hour with an acceptable leak.     Jamie Figueroa had R shoulder replacement on 02/13/13, and had PT.     His Past Medical History Is Significant  For: Past Medical History:  Diagnosis Date  . Allergy   . Arthritis    left shoulder and knees   . Chronic kidney disease    kidney stones  . Constipation    occasionally   . DM (diabetes mellitus) ([place])    takes Victoza daily;Humalog and Levemir daily  . GERD (gastroesophageal reflux disease)    but doesn't take any meds  . Headache(784.0)    occasionally to rare   . Hemorrhoids   . History of gout   . HTN (hypertension)    takes Ramipril daily  . Hypercholesteremia   . Hyperlipidemia    takes Vytorin daily  . IBS (irritable bowel syndrome)   .  Insomnia    doesn't require meds  . Irritable bowel syndrome   . Joint pain   . Morbid obesity ([place])   . Obstructive sleep apnea (adult) (pediatric)    wears cpap   . Osteoarthritis of right shoulder region 02/13/2013  . Pain in limb   . Peripheral edema    takes Furosemide daily  . Pneumonia    hx of;about 19yr ago  . Psoriasis   . Shortness of breath    with exertion  . Sleep apnea   . Snoring 12/07/2012   His Past Surgical History Is Significant For: Past Surgical History:  Procedure Laterality Date  . ANKLE SURGERY Right   . COLONOSCOPY  [id number] . ESOPHAGOGASTRODUODENOSCOPY    . KNEE SURGERY Bilateral   . TOTAL SHOULDER ARTHROPLASTY Right 02/13/2013   Procedure: TOTAL SHOULDER ARTHROPLASTY;  Surgeon: JJohnny Bridge MD;  [facility]  Service: Orthopedics;  Laterality: Right;  . TOTAL SHOULDER REPLACEMENT Right 02/13/2013   Dr LMardelle Matte   His Family History Is Significant For: Family History  Problem Relation Age of Onset  . Diabetes Mother   . Colon cancer Neg Hx   . Colon polyps Neg Hx   . Esophageal cancer Neg Hx   . Rectal cancer Neg Hx   . Stomach cancer Neg Hx     His Social History Is Significant For: Social History   Social History  . Marital status: Married    Spouse name: N/A  . Number of children: N/A  . Years of education: N/A   Social History Main Topics  . Smoking status: Never  Smoker  . Smokeless tobacco: Never Used  . Alcohol use 0.0 oz/week     Comment: occasionally beer  . Drug use: No  . Sexual activity: Yes   Other Topics Concern  . None   Social History Narrative  . None    His Allergies Are:  No Known Allergies:   His Current Medications Are:  Outpatient Encounter Prescriptions as of 05/05/2016  Medication Sig  . aspirin 81 MG tablet Take 81 mg by mouth daily.  . Choline Fenofibrate (FENOFIBRIC ACID) 135 MG CPDR Take 1 tablet by mouth daily.   . furosemide (LASIX) 40 MG tablet Take 40 mg by mouth daily.   .Marland Kitchengabapentin (NEURONTIN) 100 MG capsule TAKE ONE CAPSULE BY MOUTH 3 TIMES A DAY AS NEEDED  . HUMALOG KWIKPEN 100 UNIT/ML KiwkPen INJECT 16 UNITS WITH BIGGEST MEAL  . INVOKANA 300 MG TABS tablet Take 300 mg by mouth daily.  .Marland KitchenLEVEMIR FLEXTOUCH 100 UNIT/ML Pen INJECT 50 UNITS TWICE DAILY  . Melatonin 10 MG TABS Take 1 tablet by mouth daily.  .Marland KitchenNOVOTWIST 32G X 5 MM MISC Pt uses 5 needles daily  . ONE TOUCH ULTRA TEST test strip   . POTASSIUM GLUCONATE PO Take 1 tablet by mouth daily. Reported on 10/07/2015  . ramipril (ALTACE) 10 MG capsule Take 10 mg by mouth daily.   .Marland KitchenVICTOZA 18 MG/3ML SOPN Inject 1.8 mg into the skin daily.   .Marland KitchenVYTORIN 10-40 MG per tablet Take 1 tablet by mouth daily.   . [DISCONTINUED] LEVEMIR FLEXTOUCH 100 UNIT/ML SOPN Inject 50 Units into the skin 2 (two) times daily.   . [DISCONTINUED] meloxicam (MOBIC) 15 MG tablet   . [DISCONTINUED] NOVOLOG FLEXPEN 100 UNIT/ML SOPN FlexPen Inject 16 Units into the skin daily. Takes before his biggest meal   No facility-administered encounter medications on file as of 05/05/2016.   :  Review of Systems:  Out of a complete 14 point review of systems, all are reviewed and negative with the exception of these symptoms as listed below: Review of Systems  Neurological:       Patient states that Jamie Figueroa is doing ok on CPAP. No new concerns.     Objective:  Neurologic Exam  Physical  Exam Physical Examination:   Vitals:   05/05/16 1005  BP: 128/70  Pulse: 70  Resp: 20   General Examination: The patient is a very pleasant 67 yo gentleman in NAD, except mild pain at tailbone. Some mild weight loss since last time.   HEENT: Normocephalic, atraumatic, pupils are equal, round and reactive to light and accommodation. Extraocular tracking is good without limitation to gaze excursion or nystagmus noted. Normal smooth pursuit is noted. Hearing is grossly intact. Face is symmetric with normal facial animation and normal facial sensation. Speech is clear with no dysarthria noted. There is no hypophonia. There is no lip, neck/head, jaw or voice tremor. Neck is supple with full range of passive and active motion. There are no carotid bruits on auscultation. Oropharynx exam reveals: mild to moderate mouth dryness, mild pharyngeal erythema, adequate dental hygiene and moderate airway crowding, due to elongated uvula, redundant soft palate, larger tongue and tonsillar size of 1+. Mallampati is class II. Tongue protrudes centrally and palate elevates symmetrically.   Chest: Clear to auscultation without wheezing, rhonchi or crackles noted.  Heart: S1+S2+0, regular and normal without murmurs, rubs or gallops noted.   Abdomen: Soft, non-tender and non-distended with normal bowel sounds appreciated on auscultation.  Extremities: There is no pitting edema in the distal lower extremities bilaterally.   Skin: Warm and dry without trophic changes noted. There are no varicose veins.  Musculoskeletal: exam reveals mildly decreased range of motion in his left shoulder.   Neurologically:  Mental status: The patient is awake, alert and oriented in all 4 spheres. His memory, attention, language and knowledge are appropriate. There is no aphasia, agnosia, apraxia or anomia. Speech is clear with normal prosody and enunciation. Thought process is linear. Mood is congruent and affect is normal.   Cranial nerves are as described above under HEENT exam. In addition, shoulder shrug is normal with equal shoulder height noted. Motor exam: Normal bulk, strength and tone is noted. Jamie Figueroa has mild pain in both knees and limitation with the L shoulder. There is no drift, tremor or rebound. Romberg is negative. Reflexes are 1+ throughout. Fine motor skills are intact with normal finger taps, normal hand movements, normal rapid alternating patting, normal foot taps and normal foot agility.  Cerebellar testing shows no dysmetria or intention tremor on finger to nose testing. Heel to shin is unremarkable bilaterally. There is no truncal or gait ataxia.  Sensory exam is intact to light touch in the upper and lower extremities, reports some tingling in the feet.  Gait, station and balance are unremarkable. No veering to one side is noted. No leaning to one side is noted. Posture is age-appropriate and stance is narrow based. No problems turning are noted. Jamie Figueroa walks a little slowly, still has some tailbone pain.  Assessment and Plan:   In summary, NASHAWN HILLOCK is a very pleasant 66 year old male with an underlying medical history of hypertension, diabetes, hyperlipidemia, irritable bowel syndrome, chronic kidney disease and obesity, who presents for followup consultation of his severe obstructive sleep apnea, established on CPAP for the few years, with good compliance, and good tolerance, some residual air  leakage and issues with the nasal pillows but this is the best interface Jamie Figueroa was able to tolerate. Jamie Figueroa has lost a little bit of weight, Jamie Figueroa is encouraged to work on weight loss. Diabetes numbers seem to be good. Jamie Figueroa had a recent setback with a fall and broke his tailbone, his recovering from this. Jamie Figueroa was started on low-dose gabapentin for diabetic neuropathy and this has helped. Jamie Figueroa takes 100 mg or 200 mg at night per PCP.  Jamie Figueroa had a split-night sleep study on 12/25/2012 and we reviewed his study results previously in  detail, briefly again today, and reviewed his most recent compliance data.  I reiterated the importance of using CPAP regularly, especially in light of his other cardiovascular risk factors. Jamie Figueroa is encouraged to drink more water and exercise regularly. I will see Jamie Figueroa back routinely in one year for sleep apnea follow-up. I answered all her questions today and the patient and his wife were in agreement. I spent 25 minutes in total face-to-face time with the patient, more than 50% of which was spent in counseling and coordination of care, reviewing test results, reviewing medication and discussing or reviewing the diagnosis of OSA, its prognosis and treatment options.

## 2017-03-12 ENCOUNTER — Encounter (HOSPITAL_BASED_OUTPATIENT_CLINIC_OR_DEPARTMENT_OTHER): Payer: Self-pay | Admitting: *Deleted

## 2017-03-12 ENCOUNTER — Emergency Department (HOSPITAL_BASED_OUTPATIENT_CLINIC_OR_DEPARTMENT_OTHER): Payer: BLUE CROSS/BLUE SHIELD

## 2017-03-12 ENCOUNTER — Emergency Department (HOSPITAL_BASED_OUTPATIENT_CLINIC_OR_DEPARTMENT_OTHER)
Admission: EM | Admit: 2017-03-12 | Discharge: 2017-03-12 | Disposition: A | Discharge status: Home / Self Care | Payer: BLUE CROSS/BLUE SHIELD | Attending: Emergency Medicine | Admitting: Emergency Medicine

## 2017-03-12 ENCOUNTER — Other Ambulatory Visit: Payer: Self-pay

## 2017-03-12 DIAGNOSIS — W01198A Fall on same level from slipping, tripping and stumbling with subsequent striking against other object, initial encounter: Secondary | ICD-10-CM | POA: Insufficient documentation

## 2017-03-12 DIAGNOSIS — E1122 Type 2 diabetes mellitus with diabetic chronic kidney disease: Secondary | ICD-10-CM | POA: Diagnosis not present

## 2017-03-12 DIAGNOSIS — Y93H2 Activity, gardening and landscaping: Secondary | ICD-10-CM | POA: Diagnosis not present

## 2017-03-12 DIAGNOSIS — Z79899 Other long term (current) drug therapy: Secondary | ICD-10-CM | POA: Insufficient documentation

## 2017-03-12 DIAGNOSIS — Z7982 Long term (current) use of aspirin: Secondary | ICD-10-CM | POA: Insufficient documentation

## 2017-03-12 DIAGNOSIS — Z23 Encounter for immunization: Secondary | ICD-10-CM | POA: Insufficient documentation

## 2017-03-12 DIAGNOSIS — Z96611 Presence of right artificial shoulder joint: Secondary | ICD-10-CM | POA: Diagnosis not present

## 2017-03-12 DIAGNOSIS — S0990XA Unspecified injury of head, initial encounter: Secondary | ICD-10-CM | POA: Diagnosis present

## 2017-03-12 DIAGNOSIS — S20212A Contusion of left front wall of thorax, initial encounter: Secondary | ICD-10-CM | POA: Diagnosis not present

## 2017-03-12 DIAGNOSIS — W010XXA Fall on same level from slipping, tripping and stumbling without subsequent striking against object, initial encounter: Secondary | ICD-10-CM

## 2017-03-12 DIAGNOSIS — S40812A Abrasion of left upper arm, initial encounter: Secondary | ICD-10-CM | POA: Diagnosis not present

## 2017-03-12 DIAGNOSIS — I129 Hypertensive chronic kidney disease with stage 1 through stage 4 chronic kidney disease, or unspecified chronic kidney disease: Secondary | ICD-10-CM | POA: Insufficient documentation

## 2017-03-12 DIAGNOSIS — S60222A Contusion of left hand, initial encounter: Secondary | ICD-10-CM

## 2017-03-12 DIAGNOSIS — N189 Chronic kidney disease, unspecified: Secondary | ICD-10-CM | POA: Diagnosis not present

## 2017-03-12 DIAGNOSIS — S80812A Abrasion, left lower leg, initial encounter: Secondary | ICD-10-CM | POA: Insufficient documentation

## 2017-03-12 DIAGNOSIS — Z794 Long term (current) use of insulin: Secondary | ICD-10-CM | POA: Insufficient documentation

## 2017-03-12 DIAGNOSIS — Y999 Unspecified external cause status: Secondary | ICD-10-CM | POA: Diagnosis not present

## 2017-03-12 DIAGNOSIS — S0083XA Contusion of other part of head, initial encounter: Secondary | ICD-10-CM | POA: Insufficient documentation

## 2017-03-12 DIAGNOSIS — T07XXXA Unspecified multiple injuries, initial encounter: Secondary | ICD-10-CM

## 2017-03-12 DIAGNOSIS — Y92017 Garden or yard in single-family (private) house as the place of occurrence of the external cause: Secondary | ICD-10-CM | POA: Insufficient documentation

## 2017-03-12 DIAGNOSIS — S0093XA Contusion of unspecified part of head, initial encounter: Secondary | ICD-10-CM

## 2017-03-12 MED ORDER — BACITRACIN ZINC 500 UNIT/GM EX OINT
TOPICAL_OINTMENT | Freq: Once | CUTANEOUS | Status: AC
  Administered 2017-03-12: 1 via TOPICAL
  Filled 2017-03-12: qty 28.35

## 2017-03-12 MED ORDER — TETANUS-DIPHTH-ACELL PERTUSSIS 5-2.5-18.5 LF-MCG/0.5 IM SUSP
0.5000 mL | Freq: Once | INTRAMUSCULAR | Status: AC
  Administered 2017-03-12: 0.5 mL via INTRAMUSCULAR
  Filled 2017-03-12: qty 0.5

## 2017-03-12 MED ORDER — ACETAMINOPHEN 500 MG PO TABS
1000.0000 mg | ORAL_TABLET | Freq: Once | ORAL | Status: AC
  Administered 2017-03-12: 1000 mg via ORAL
  Filled 2017-03-12: qty 2

## 2017-03-12 NOTE — ED Notes (Signed)
Patient is A & O x4.  Patient understood AVS instruction.

## 2017-03-12 NOTE — ED Notes (Signed)
Bacitracin with tegaderm dressing applied to LFA open skin area measuring 2.5 cm;  3 cm to left lateral hand;  1.5 x 1 cm to left shin and 1 x 1.5 cm to left shin just below the above open skin area.

## 2017-03-12 NOTE — ED Notes (Signed)
ED Provider at bedside. 

## 2017-03-12 NOTE — ED Triage Notes (Signed)
Pt c/o fall from standing landing on brick x 30 mins ago , right facial injury and right hand injury

## 2017-03-12 NOTE — Discharge Instructions (Signed)
It was our pleasure to provide your ER care today - we hope that you feel better.  Keep wounds very clean.  You may apply a thin coat of bacitracin to abrasions as need for the next few days.   Take acetaminophen as need for pain.  Follow up with primary care doctor in 1 week if symptoms fail to improve/resolve.  Return to ER if worse, new symptoms, new or severe pain, other concern.

## 2017-03-12 NOTE — ED Provider Notes (Signed)
[facility] EMERGENCY DEPARTMENT Provider Note   CSN: [id number] Arrival date & time: 03/12/17  1246     History   Chief Complaint Chief Complaint  Patient presents with  . Fall    HPI Jamie Figueroa is a 66 y.o. male.  Patient presents s/p fall today at home. Was working in yard, stepped on uneven ground, tripped, fell forward. Hit head. Dazed. No loc. Headache since hitting head. Moderate, constant, persistent. No nv. Also c/o abrasions to LUE and LLE - tetanus unknown. Denies neck or back pain. Also c/o left lateral lower rib pain, worse w palpation and certain movements. No abd pain. Ambulatory post fall. No faintness or dizziness prior to fall, or since. No anticoag use.    The history is provided by the patient.  Fall  Associated symptoms include headaches. Pertinent negatives include no chest pain, no abdominal pain and no shortness of breath.    Past Medical History:  Diagnosis Date  . Allergy   . Arthritis    left shoulder and knees   . Chronic kidney disease    kidney stones  . Constipation    occasionally   . DM (diabetes mellitus) ([place])    takes Victoza daily;Humalog and Levemir daily  . GERD (gastroesophageal reflux disease)    but doesn't take any meds  . Headache(784.0)    occasionally to rare   . Hemorrhoids   . History of gout   . HTN (hypertension)    takes Ramipril daily  . Hypercholesteremia   . Hyperlipidemia    takes Vytorin daily  . IBS (irritable bowel syndrome)   . Insomnia    doesn't require meds  . Irritable bowel syndrome   . Joint pain   . Morbid obesity ([place])   . Obstructive sleep apnea (adult) (pediatric)    wears cpap   . Osteoarthritis of right shoulder region 02/13/2013  . Pain in limb   . Peripheral edema    takes Furosemide daily  . Pneumonia    hx of;about 71yrs ago  . Psoriasis   . Shortness of breath    with exertion  . Sleep apnea   . Snoring 12/07/2012    Patient Active Problem List   Diagnosis Date  Noted  . Osteoarthritis of right shoulder region 02/13/2013  . Snoring 12/07/2012    Past Surgical History:  Procedure Laterality Date  . ANKLE SURGERY Right   . COLONOSCOPY  [id number]  . ESOPHAGOGASTRODUODENOSCOPY    . KNEE SURGERY Bilateral   . TOTAL SHOULDER ARTHROPLASTY Right 02/13/2013   Procedure: TOTAL SHOULDER ARTHROPLASTY;  Surgeon: Johnny Bridge, MD;  Location: [facility];  Service: Orthopedics;  Laterality: Right;  . TOTAL SHOULDER REPLACEMENT Right 02/13/2013   Dr Mardelle Matte       Home Medications    Prior to Admission medications   Medication Sig Start Date End Date Taking? Authorizing Provider  budesonide-formoterol (SYMBICORT) 160-4.5 MCG/ACT inhaler Inhale 2 puffs into the lungs 2 (two) times daily.   Yes [provider]  predniSONE (DELTASONE) 10 MG tablet Take 5 mg by mouth daily with breakfast.   Yes [provider]  aspirin 81 MG tablet Take 81 mg by mouth daily.    [provider]  Choline Fenofibrate (FENOFIBRIC ACID) 135 MG CPDR Take 1 tablet by mouth daily.  11/20/12   [provider]  furosemide (LASIX) 40 MG tablet Take 40 mg by mouth daily.  10/21/12   [provider]  gabapentin (NEURONTIN)  100 MG capsule TAKE ONE CAPSULE BY MOUTH 3 TIMES A DAY AS NEEDED 03/26/16   [provider]  HUMALOG KWIKPEN 100 UNIT/ML KiwkPen INJECT 16 UNITS WITH BIGGEST MEAL 03/19/16   [provider]  INVOKANA 300 MG TABS tablet Take 300 mg by mouth daily. 02/24/14   [provider]  LEVEMIR FLEXTOUCH 100 UNIT/ML Pen INJECT 50 UNITS TWICE DAILY 02/14/16   [provider]  Melatonin 10 MG TABS Take 1 tablet by mouth daily.    [provider]  NOVOTWIST 32G X 5 MM MISC Pt uses 5 needles daily 10/24/12   [provider]  ONE TOUCH ULTRA TEST test strip  11/29/12   [provider]  POTASSIUM GLUCONATE PO Take 1 tablet by mouth daily. Reported on 10/07/2015    [provider]    ramipril (ALTACE) 10 MG capsule Take 10 mg by mouth daily.  11/20/12   [provider]  VICTOZA 18 MG/3ML SOPN Inject 1.8 mg into the skin daily.  10/23/12   [provider]  VYTORIN 10-40 MG per tablet Take 1 tablet by mouth daily.  11/28/12   [provider]    Family History Family History  Problem Relation Age of Onset  . Diabetes Mother   . Colon cancer Neg Hx   . Colon polyps Neg Hx   . Esophageal cancer Neg Hx   . Rectal cancer Neg Hx   . Stomach cancer Neg Hx     Social History Social History   Tobacco Use  . Smoking status: Never Smoker  . Smokeless tobacco: Never Used  Substance Use Topics  . Alcohol use: Yes    Alcohol/week: 0.0 oz    Comment: occasionally beer  . Drug use: No     Allergies   Patient has no known allergies.   Review of Systems Review of Systems  Constitutional: Negative for fever.  HENT: Negative for nosebleeds.   Eyes: Negative for pain and visual disturbance.  Respiratory: Negative for shortness of breath.   Cardiovascular: Negative for chest pain.  Gastrointestinal: Negative for abdominal pain and vomiting.  Genitourinary: Negative for flank pain.  Musculoskeletal: Negative for back pain and neck pain.  Skin: Positive for wound.  Neurological: Positive for headaches. Negative for weakness and numbness.  Hematological: Does not bruise/bleed easily.  Psychiatric/Behavioral: Negative for confusion.     Physical Exam Updated Vital Signs BP 120/60 (BP Location: Left Arm)   Pulse 66   Temp 97.8 F (36.6 C)   Resp 16   Ht 1.778 m (5\' 10" )   Wt 117.9 kg (260 lb)   SpO2 98%   BMI 37.31 kg/m   Physical Exam  Constitutional: He is oriented to person, place, and time. He appears well-developed and well-nourished. No distress.  HENT:  Mouth/Throat: Oropharynx is clear and moist.  Minimal contusion to left cheek. Facial bones/orbits intact. No malocclusion. tms normal.   Eyes: Conjunctivae are normal.  Pupils are equal, round, and reactive to light.  Neck: Normal range of motion. Neck supple. No tracheal deviation present.  Cardiovascular: Normal rate, regular rhythm, normal heart sounds and intact distal pulses.  Pulmonary/Chest: Effort normal and breath sounds normal. No accessory muscle usage. No respiratory distress. He exhibits tenderness.  Left lower lateral chest wall tenderness. No crepitus. Normal chest movement.   Abdominal: Soft. He exhibits no distension. There is no tenderness.  No abd bruising or contusion.   Musculoskeletal: He exhibits no edema.  CTLS spine, non tender,  aligned, no step off. Superficial abrasion to LUE/LLE (superficial skin tears).  Tenderness left hand over 5th metacarpal. No other focal bony tenderness on extremity exam.   Neurological: He is alert and oriented to person, place, and time.  Speech clear/fluent. Motor/sens grossly intact bil. Steady gait.   Skin: Skin is warm and dry. He is not diaphoretic.  Psychiatric: He has a normal mood and affect.  Nursing note and vitals reviewed.    ED Treatments / Results  Labs (all labs ordered are listed, but only abnormal results are displayed) Labs Reviewed - No data to display  EKG  EKG Interpretation None       Radiology Dg Ribs Unilateral W/chest Left  Result Date: 03/12/2017 CLINICAL DATA:  Fall, left chest and rib pain EXAM: LEFT RIBS AND CHEST - 3+ VIEW COMPARISON:  02/05/2013 FINDINGS: Stable heart size and vascularity. Lungs remain clear. No focal pneumonia, collapse or consolidation. Negative for edema, effusion or pneumothorax. Trachea is midline. Previous right shoulder arthroplasty noted. Degenerative changes of the left shoulder. No focal left rib abnormality or displaced fracture. No chest wall subcutaneous emphysema. IMPRESSION: No acute finding by plain radiography Electronically Signed   By: Jerilynn Mages.  Shick M.D.   On: 03/12/2017 13:43   Ct Head Wo Contrast  Result Date:  03/12/2017 CLINICAL DATA:  Fall from standing landing on a brick. Left facial injury. EXAM: CT HEAD WITHOUT CONTRAST TECHNIQUE: Contiguous axial images were obtained from the base of the skull through the vertex without intravenous contrast. COMPARISON:  None. FINDINGS: Brain: There is no evidence for acute hemorrhage, hydrocephalus, mass lesion, or abnormal extra-axial fluid collection. No definite CT evidence for acute infarction. Vascular: No hyperdense vessel or unexpected calcification. Skull: No evidence for fracture. No worrisome lytic or sclerotic lesion. Sinuses/Orbits: The visualized paranasal sinuses and mastoid air cells are clear. Visualized portions of the globes and intraorbital fat are unremarkable. Other: None. IMPRESSION: No acute intracranial abnormality. Electronically Signed   By: Misty Stanley M.D.   On: 03/12/2017 13:33   Dg Hand Complete Left  Result Date: 03/12/2017 CLINICAL DATA:  Fall, left hand pain EXAM: LEFT HAND - COMPLETE 3+ VIEW COMPARISON:  None. FINDINGS: Osteoarthritic changes in the IP joints and first carpometacarpal joint as well as radiocarpal joint. No acute bony abnormality. Specifically, no fracture, subluxation, or dislocation. Soft tissues are intact. IMPRESSION: No acute bony abnormality. Electronically Signed   By: Rolm Baptise M.D.   On: 03/12/2017 13:41    Procedures Procedures (including critical care time)  Medications Ordered in ED Medications  Tdap (BOOSTRIX) injection 0.5 mL (not administered)  bacitracin ointment (not administered)     Initial Impression / Assessment and Plan / ED Course  I have reviewed the triage vital signs and the nursing notes.  Pertinent labs & imaging results that were available during my care of the patient were reviewed by me and considered in my medical decision making (see chart for details).  Imaging studies ordered.   Wounds cleaned, bacitracin and sterile dressing applied.   Tetanus im.   Acetaminophen  po.  Reviewed nursing notes and prior charts for additional history.   Imaging studies negative for acute injury.  Patient appear stable for d/c.     Final Clinical Impressions(s) / ED Diagnoses   Final diagnoses:  None    ED Discharge Orders    None       Lajean Saver, MD 03/12/17 [phone]

## 2017-05-09 ENCOUNTER — Telehealth: Payer: Self-pay

## 2017-05-09 NOTE — Telephone Encounter (Signed)
I called pt and reminded him to bring his cpap to his appt tomorrow with Dr. Athar. Pt verbalized understanding.  

## 2017-05-10 ENCOUNTER — Encounter: Payer: Self-pay | Admitting: Neurology

## 2017-05-10 ENCOUNTER — Ambulatory Visit: Payer: Medicare Other | Admitting: Neurology

## 2017-05-10 VITALS — BP 132/57 | HR 59 | Ht 70.0 in | Wt 271.0 lb

## 2017-05-10 DIAGNOSIS — Z9989 Dependence on other enabling machines and devices: Secondary | ICD-10-CM

## 2017-05-10 DIAGNOSIS — G4733 Obstructive sleep apnea (adult) (pediatric): Secondary | ICD-10-CM

## 2017-05-10 NOTE — Progress Notes (Signed)
Subjective:    Patient ID: Jamie Figueroa is a 67 y.o. male.  HPI     Interim history:   Jamie Figueroa is a very pleasant 67 year old right-handed gentleman with an underlying medical history of hypertension, diabetes, hyperlipidemia, irritable bowel syndrome, chronic kidney disease and obesity, who presents for followup consultation of his obstructive sleep apnea, on CPAP therapy at home. The patient is accompanied by his wife again today. I last saw him on 05/05/2017, at which time he was reasonably good with his CPAP compliance. He had started gabapentin per PCP. A1c had improved.  Today, 05/10/2017: I reviewed his CPAP compliance data from 04/10/2017 through 05/09/2017 which is a total of 30 days, during which time he used his machine 27 days with percent used days greater than 4 hours at 83%, indicating very good compliance with an average usage of 5 hours and 58 minutes, residual AHI borderline at 5.5 per hour, leaked high with the 95th percentile at 38 L/m on a pressure of 11 cm with EPR of 3. He reports doing well, CPAP is going well for him. But he does need new supplies including the headgear which tends to stretch out. He may not have received a head year in a year. He has not lost a lot of weight, weight seems to fluctuate. He does not always drink enough water he admits. He likes to drink diet sodas.   Previously:     I saw him on 04/16/2015, at which time he reported doing okay, he had been using CPAP with adequate compliance. He had lost a little bit of weight. He had lost about 10 pounds in a year. He did report difficulty with recurrent allergy symptoms and nasal congestion, making CPAP usage around that time difficult.    I reviewed his CPAP compliance data from 04/05/2016 through 05/04/2016, which is a total of 30 days, during which time he used his machine 26 days with percent used days greater than 4 hours at 73%, indicating adequate compliance with an average usage of 5 hours  and 18 minutes, residual AHI 12.2 per hour which is elevated, mostly obstructive in nature and leak elevated at 39.8 L/m on a pressure of 11 cm with EPR of 3.   I saw him on 03/27/2014, at which time he reported doing fairly well with CPAP therapy. He felt stable memory wise. His diabetes was under control and he was on a new medication. He had knee pain bilaterally and was status post recent steroid injections under Dr. Mardelle Matte. He was compliant with treatment.   I reviewed his CPAP compliance data from 01/20/2015 through 02/18/2015 which is a total of 30 days during which time he used his machine 29 days with percent used days greater than 4 hours at 77%, indicating adequate compliance with an average usage of 5 hours and 2 minutes, residual AHI 3.5 per hour, leak at times high with the 95th percentile at 39.4 L/m on a pressure of 11 cm with EPR of 3.   I saw him on 09/25/13, at which time he was having difficulty with his mask. He needed new supplies. He was also taking his mask off in the middle of the night without realizing it. Sometimes in the morning hours he would not put his mask back on. He was sleepy during the day.    I reviewed his compliance data from 02/23/14 to 03/24/14, which is a total of 30 days, during which time he used his machine 26  days, with percent used days greater than 4 hours of 77%, indicating adequate compliance. Average usage of 5 hours and 12 minutes for all nights. Residual AHI at 2.9 per hour indicating an appropriate treatment pressure of 11 cm with EPR of 3. Leak at times was high with the 95th percentile at 31.7 L/m.   I saw him on 03/29/13, at which time he reported doing well and was compliant with CPAP therapy. I reviewed his compliance data from 08/21/13 to 09/19/13, which is a total of 30 days, during which time he used CPAP regularly except 5 night, his average usage for all days was 4 hours and 14 minutes, and percent used days >4 hours was 63%, indicating  suboptimal compliance. Residual AHI was elevated at 8.1/h (mostly obstructive) and leak was at times high, with 95th percentile at 37.5 lpm, at the setting of 11 cm and EPR of 3.    I first met him on 12/07/2012, at which time he complained of snoring, excessive daytime somnolence, non-restorative sleep, and disrupted sleep. I advised him to undergo a sleep study and he had a split-night sleep study on 12/25/2012. His baseline sleep efficiency was significantly reduced at 50.5% with a sleep latency of 42.5 minutes and wake after sleep onset of 78 minutes with severe sleep fragmentation noted. He had an increased percentage of stage I and stage II sleep and absence of slow-wave and REM sleep prior to CPAP. He had frequent PVCs and a brief run of tachycardia. He had severe periodic leg movements of sleep but with very little arousals. He had mild to moderate and at times loud snoring. His total AHI was 62.9 per hour. His baseline oxygen saturation was 91%, his nadir was 85%. He was then titrated on CPAP from 5-9 cm of water pressure and was briefly tried on BiPAP because of CPAP the emergent central apneas. He did not do well on BiPAP and switched back to CPAP of 9 cm and titrated to a final pressure of 10 cm with a reduction of the AHI to 6.8 per hour but no complete elimination of his sleep disordered breathing. I prescribed CPAP at 10 cm of water pressure and added a chin strap as he was noted to have significant mouth opening and oral sputtering. He did not tolerate a full face mask during the study.     I reviewed compliance data from 02/05/2013 through 03/06/2013 which is a total of 30 days, during which time he used CPAP every night except for one night. Percent used days greater than 4 hours was 73%, indicating adequate compliance. His pressure was 10 cm with EPR of 3. His leak was acceptable. However, his residual AHI was high at 19.5 and based on this I faxed a CPAP pressure increase request to his DME  company on 03/13/2013. His pressure was increased to 11 cm.     I reviewed compliance from 02/27/2013 to 03/28/2013 which is of total of 30 days during which time he uses CPAP every night. Percent used days greater than 4 hours was 93% indicating excellent compliance. His pressure is 11 cm with EPR of 3. His residual AHI is much improved to 4.8 per hour with an acceptable leak.     He had R shoulder replacement on 02/13/13, and had PT.    His Past Medical History Is Significant For: Past Medical History:  Diagnosis Date  . Allergy   . Arthritis    left shoulder and knees   . Chronic  kidney disease    kidney stones  . Constipation    occasionally   . DM (diabetes mellitus) ([place])    takes Victoza daily;Humalog and Levemir daily  . GERD (gastroesophageal reflux disease)    but doesn't take any meds  . Headache(784.0)    occasionally to rare   . Hemorrhoids   . History of gout   . HTN (hypertension)    takes Ramipril daily  . Hypercholesteremia   . Hyperlipidemia    takes Vytorin daily  . IBS (irritable bowel syndrome)   . Insomnia    doesn't require meds  . Irritable bowel syndrome   . Joint pain   . Morbid obesity ([place])   . Obstructive sleep apnea (adult) (pediatric)    wears cpap   . Osteoarthritis of right shoulder region 02/13/2013  . Pain in limb   . Peripheral edema    takes Furosemide daily  . Pneumonia    hx of;about 30yr ago  . Psoriasis   . Shortness of breath    with exertion  . Sleep apnea   . Snoring 12/07/2012    His Past Surgical History Is Significant For: Past Surgical History:  Procedure Laterality Date  . ANKLE SURGERY Right   . COLONOSCOPY  [id number] . ESOPHAGOGASTRODUODENOSCOPY    . KNEE SURGERY Bilateral   . TOTAL SHOULDER ARTHROPLASTY Right 02/13/2013   Procedure: TOTAL SHOULDER ARTHROPLASTY;  Surgeon: JJohnny Bridge MD;  Location: [facility]  Service: Orthopedics;  Laterality: Right;  . TOTAL SHOULDER REPLACEMENT Right 02/13/2013   Dr LMardelle Matte    His Family History Is Significant For: Family History  Problem Relation Age of Onset  . Diabetes Mother   . Colon cancer Neg Hx   . Colon polyps Neg Hx   . Esophageal cancer Neg Hx   . Rectal cancer Neg Hx   . Stomach cancer Neg Hx     His Social History Is Significant For: Social History   Socioeconomic History  . Marital status: Married    Spouse name: None  . Number of children: None  . Years of education: None  . Highest education level: None  Social Needs  . Financial resource strain: None  . Food insecurity - worry: None  . Food insecurity - inability: None  . Transportation needs - medical: None  . Transportation needs - non-medical: None  Occupational History  . None  Tobacco Use  . Smoking status: Never Smoker  . Smokeless tobacco: Never Used  Substance and Sexual Activity  . Alcohol use: Yes    Alcohol/week: 0.0 oz    Comment: occasionally beer  . Drug use: No  . Sexual activity: Yes  Other Topics Concern  . None  Social History Narrative  . None    His Allergies Are:  No Known Allergies:   His Current Medications Are:  Outpatient Encounter Medications as of 05/10/2017  Medication Sig  . aspirin 81 MG tablet Take 81 mg by mouth daily.  . budesonide-formoterol (SYMBICORT) 160-4.5 MCG/ACT inhaler Inhale 2 puffs into the lungs 2 (two) times daily.  . Choline Fenofibrate (FENOFIBRIC ACID) 135 MG CPDR Take 1 tablet by mouth daily.   . furosemide (LASIX) 40 MG tablet Take 40 mg by mouth daily.   .Marland Kitchengabapentin (NEURONTIN) 100 MG capsule TAKE ONE CAPSULE BY MOUTH 1 TIME A DAY AS NEEDED  . HUMALOG KWIKPEN 100 UNIT/ML KiwkPen INJECT 16 UNITS WITH BIGGEST MEAL  . INVOKANA 300 MG TABS tablet Take 300  mg by mouth daily.  Marland Kitchen LEVEMIR FLEXTOUCH 100 UNIT/ML Pen INJECT 50 UNITS TWICE DAILY  . Melatonin 10 MG TABS Take 1 tablet by mouth daily.  Marland Kitchen NOVOTWIST 32G X 5 MM MISC Pt uses 5 needles daily  . ONE TOUCH ULTRA TEST test strip   . POTASSIUM GLUCONATE PO Take 1  tablet by mouth daily as needed. Reported on 10/07/2015  . ramipril (ALTACE) 10 MG capsule Take 10 mg by mouth daily.   Marland Kitchen VICTOZA 18 MG/3ML SOPN Inject 1.8 mg into the skin daily.   Marland Kitchen VYTORIN 10-40 MG per tablet Take 1 tablet by mouth daily.   . [DISCONTINUED] predniSONE (DELTASONE) 10 MG tablet Take 5 mg by mouth daily with breakfast.   No facility-administered encounter medications on file as of 05/10/2017.   :  Review of Systems:  Out of a complete 14 point review of systems, all are reviewed and negative with the exception of these symptoms as listed below:  Review of Systems  Neurological:       Pt presents today to discuss his cpap. Pt reports that his cpap is going well.    Objective:  Neurological Exam  Physical Exam Physical Examination:   Vitals:   05/10/17 1039  BP: (!) 132/57  Pulse: (!) 59   General Examination: The patient is a very pleasant 67 y.o. male in no acute distress. He appears well-developed and well-nourished and well groomed.   HEENT: Normocephalic, atraumatic, pupils are equal, round and reactive to light and accommodation. Extraocular tracking is good without limitation to gaze excursion or nystagmus noted. Normal smooth pursuit is noted. Hearing is grossly intact. Face is symmetric with normal facial animation and normal facial sensation. Speech is clear with no dysarthria noted. There is no hypophonia. There is no lip, neck/head, jaw or voice tremor. Neck is supple with full range of passive and active motion. There are no carotid bruits on auscultation. Oropharynx exam reveals: mild to moderate mouth dryness, adequate dental hygiene and moderate airway crowding, tonsillar size of 1+. Mallampati is class II. Tongue protrudes centrally and palate elevates symmetrically.   Chest: Clear to auscultation without wheezing, rhonchi or crackles noted.  Heart: S1+S2+0, regular and normal without murmurs, rubs or gallops noted.   Abdomen: Soft, non-tender and  non-distended with normal bowel sounds appreciated on auscultation.  Extremities: There is no pitting edema in the distal lower extremities bilaterally.   Skin: Warm and dry without trophic changes noted. There are no varicose veins.  Musculoskeletal: exam reveals mildly decreased range of motion in his left shoulder.   Neurologically:  Mental status: The patient is awake, alert and oriented in all 4 spheres. His memory, attention, language and knowledge are appropriate. There is no aphasia, agnosia, apraxia or anomia. Speech is clear with normal prosody and enunciation. Thought process is linear. Mood is congruent and affect is normal.  Cranial nerves are as described above under HEENT exam. In addition, shoulder shrug is normal with equal shoulder height noted. Motor exam: Normal bulk, strength and tone is noted. He has mild pain in both knees and limitation with the L shoulder. There is no drift, tremor or rebound. Romberg is negative. Reflexes are 1+ throughout. Fine motor skills are intact with normal finger taps, normal hand movements, normal rapid alternating patting, normal foot taps and normal foot agility.  Cerebellar testing shows no dysmetria or intention tremor. There is no truncal or gait ataxia.  Sensory exam is intact to light touch in the upper  and lower extremities, reports some tingling in the feet.  Gait, station and balance are unremarkable. No veering to one side is noted. No leaning to one side is noted. Posture is age-appropriate and stance is narrow based. No problems turning are noted. He walks a little slowly, no limp.   Assessment and Plan:   In summary, Jamie Figueroa is a very pleasant 67 year old male with an underlying medical history of hypertension, diabetes, hyperlipidemia, irritable bowel syndrome, chronic kidney disease and obesity, who presents for followup consultation of his severe obstructive sleep apnea, established on CPAP for the few years, with  good compliance, and good tolerance, some residual air leakage from the mask. He is commended for his treatment adherence, he is advised to try to work on weight loss. He needs new supplies, I placed an order for this. He is advised to try to stay up-to-date with his supply renewals. Of note, he had a split-night sleep study on 12/25/2012 and we reviewed his study results previously in detail, and reviewed his most recent compliance data together. I reiterated the importance of using CPAP regularly. He is encouraged to drink more water and exercise regularly. I suggested a one-year checkup, he can see one of our nurse practitioners next time as he is stable. I answered all their questions today and the patient and his wife were in agreement. I spent 20 minutes in total face-to-face time with the patient, more than 50% of which was spent in counseling and coordination of care, reviewing test results, reviewing medication and discussing or reviewing the diagnosis of OSA, its prognosis and treatment options. Pertinent laboratory and imaging test results that were available during this visit with the patient were reviewed by me and considered in my medical decision making (see chart for details).

## 2017-05-10 NOTE — Patient Instructions (Addendum)
Please continue using your CPAP regularly. While your insurance requires that you use CPAP at least 4 hours each night on 70% of the nights, I recommend, that you not skip any nights and use it throughout the night if you can. Getting used to CPAP and staying with the treatment long term does take time and patience and discipline. Untreated obstructive sleep apnea when it is moderate to severe can have an adverse impact on cardiovascular health and raise her risk for heart disease, arrhythmias, hypertension, congestive heart failure, stroke and diabetes. Untreated obstructive sleep apnea causes sleep disruption, nonrestorative sleep, and sleep deprivation. This can have an impact on your day to day functioning and cause daytime sleepiness and impairment of cognitive function, memory loss, mood disturbance, and problems focussing. Using CPAP regularly can improve these symptoms. Please work on weight loss, and drink more water! I will order new supplies through [facility].  We can see you in 1 year, you can see one of our nurse practitioners as you are stable. I will see you after that.

## 2018-05-11 ENCOUNTER — Encounter: Payer: Self-pay | Admitting: Adult Health

## 2018-05-11 ENCOUNTER — Ambulatory Visit: Payer: Medicare Other | Admitting: Adult Health

## 2018-05-11 VITALS — BP 143/66 | HR 57 | Ht 70.0 in | Wt 268.2 lb

## 2018-05-11 DIAGNOSIS — G4733 Obstructive sleep apnea (adult) (pediatric): Secondary | ICD-10-CM | POA: Diagnosis not present

## 2018-05-11 DIAGNOSIS — Z9989 Dependence on other enabling machines and devices: Secondary | ICD-10-CM

## 2018-05-11 NOTE — Progress Notes (Addendum)
PATIENT: Jamie Figueroa DOB: Apr 13, 1950  REASON FOR VISIT: follow up- OSA on CPAP HISTORY FROM: patient  HISTORY OF PRESENT ILLNESS: Today 05/11/18:  Jamie Figueroa is a 68 year old male with a history of obstructive sleep apnea on CPAP.  He returns today for follow-up.  His CPAP download indicates that he uses machine 24 out of 30 days for compliance of 80%.  He uses machine greater than 4 hours each night.  On average he uses his machine 6 hours and 2 minutes.  His residual AHI is 3.7 on 11 cm of water with EPR of 3.  His leak in the 95th percentile is 39.2.  He states that every time they send new supplies they do not replace his straps.  The patient would like a new machine as long as he qualifies.    HISTORY 05/10/2017: I reviewed his CPAP compliance data from 04/10/2017 through 05/09/2017 which is a total of 30 days, during which time he used his machine 27 days with percent used days greater than 4 hours at 83%, indicating very good compliance with an average usage of 5 hours and 58 minutes, residual AHI borderline at 5.5 per hour, leaked high with the 95th percentile at 38 L/m on a pressure of 11 cm with EPR of 3. He reports doing well, CPAP is going well for him.But he does need new supplies including the headgear which tends to stretch out. He may not have received a head year in a year. He has not lost a lot of weight, weight seems to fluctuate. He does not always drink enough water he admits. He likes to drink diet sodas.  REVIEW OF SYSTEMS: Out of a complete 14 system review of symptoms, the patient complains only of the following symptoms, and all other reviewed systems are negative.   See HPI  ALLERGIES: No Known Allergies  HOME MEDICATIONS: Outpatient Medications Prior to Visit  Medication Sig Dispense Refill  . aspirin 81 MG tablet Take 81 mg by mouth daily.    . Choline Fenofibrate (FENOFIBRIC ACID) 135 MG CPDR Take 1 tablet by mouth daily.     . furosemide (LASIX) 40  MG tablet Take 40 mg by mouth daily.     Marland Kitchen gabapentin (NEURONTIN) 100 MG capsule TAKE ONE CAPSULE BY MOUTH 1 TIME A DAY AS NEEDED  0  . HUMALOG KWIKPEN 100 UNIT/ML KiwkPen INJECT 16 UNITS WITH BIGGEST MEAL  3  . INVOKANA 300 MG TABS tablet Take 300 mg by mouth daily.  6  . LEVEMIR FLEXTOUCH 100 UNIT/ML Pen INJECT 50 UNITS TWICE DAILY  2  . Melatonin 10 MG TABS Take 1 tablet by mouth daily.    Marland Kitchen NOVOTWIST 32G X 5 MM MISC Pt uses 5 needles daily    . ONE TOUCH ULTRA TEST test strip     . POTASSIUM GLUCONATE PO Take 1 tablet by mouth daily as needed. Reported on 10/07/2015    . ramipril (ALTACE) 10 MG capsule Take 10 mg by mouth daily.     Marland Kitchen VICTOZA 18 MG/3ML SOPN Inject 1.8 mg into the skin daily.     Marland Kitchen VYTORIN 10-40 MG per tablet Take 1 tablet by mouth daily.     . budesonide-formoterol (SYMBICORT) 160-4.5 MCG/ACT inhaler Inhale 2 puffs into the lungs 2 (two) times daily.     No facility-administered medications prior to visit.     PAST MEDICAL HISTORY: Past Medical History:  Diagnosis Date  . Allergy   . Arthritis  left shoulder and knees   . Chronic kidney disease    kidney stones  . Constipation    occasionally   . DM (diabetes mellitus) ([place])    takes Victoza daily;Humalog and Levemir daily  . GERD (gastroesophageal reflux disease)    but doesn't take any meds  . Headache(784.0)    occasionally to rare   . Hemorrhoids   . History of gout   . HTN (hypertension)    takes Ramipril daily  . Hypercholesteremia   . Hyperlipidemia    takes Vytorin daily  . IBS (irritable bowel syndrome)   . Insomnia    doesn't require meds  . Irritable bowel syndrome   . Joint pain   . Morbid obesity ([place])   . Obstructive sleep apnea (adult) (pediatric)    wears cpap   . Osteoarthritis of right shoulder region 02/13/2013  . Pain in limb   . Peripheral edema    takes Furosemide daily  . Pneumonia    hx of;about 2yrs ago  . Psoriasis   . Shortness of breath    with exertion  . Sleep  apnea   . Snoring 12/07/2012    PAST SURGICAL HISTORY: Past Surgical History:  Procedure Laterality Date  . ANKLE SURGERY Right   . COLONOSCOPY  [id number]  . ESOPHAGOGASTRODUODENOSCOPY    . KNEE SURGERY Bilateral   . TOTAL SHOULDER ARTHROPLASTY Right 02/13/2013   Procedure: TOTAL SHOULDER ARTHROPLASTY;  Surgeon: Johnny Bridge, MD;  Location: [facility];  Service: Orthopedics;  Laterality: Right;  . TOTAL SHOULDER REPLACEMENT Right 02/13/2013   Dr Mardelle Matte    FAMILY HISTORY: Family History  Problem Relation Age of Onset  . Diabetes Mother   . Colon cancer Neg Hx   . Colon polyps Neg Hx   . Esophageal cancer Neg Hx   . Rectal cancer Neg Hx   . Stomach cancer Neg Hx     SOCIAL HISTORY: Social History   Socioeconomic History  . Marital status: Married    Spouse name: Not on file  . Number of children: Not on file  . Years of education: Not on file  . Highest education level: Not on file  Occupational History  . Not on file  Social Needs  . Financial resource strain: Not on file  . Food insecurity:    Worry: Not on file    Inability: Not on file  . Transportation needs:    Medical: Not on file    Non-medical: Not on file  Tobacco Use  . Smoking status: Never Smoker  . Smokeless tobacco: Never Used  Substance and Sexual Activity  . Alcohol use: Yes    Alcohol/week: 0.0 standard drinks    Comment: occasionally beer  . Drug use: No  . Sexual activity: Yes  Lifestyle  . Physical activity:    Days per week: Not on file    Minutes per session: Not on file  . Stress: Not on file  Relationships  . Social connections:    Talks on phone: Not on file    Gets together: Not on file    Attends religious service: Not on file    Active member of club or organization: Not on file    Attends meetings of clubs or organizations: Not on file    Relationship status: Not on file  . Intimate partner violence:    Fear of current or ex partner: Not on file    Emotionally abused: Not  on file  Physically abused: Not on file    Forced sexual activity: Not on file  Other Topics Concern  . Not on file  Social History Narrative  . Not on file      PHYSICAL EXAM  Vitals:   05/11/18 1241  BP: (!) 143/66  Pulse: (!) 57  Weight: 268 lb 3.2 oz (121.7 kg)  Height: 5\' 10"  (1.778 m)   Body mass index is 38.48 kg/m.  Generalized: Well developed, in no acute distress  Chest: Lung sounds clear to auscultation bilaterally, heart sounds are normal  Neurological examination  Mentation: Alert oriented to time, place, history taking. Follows all commands speech and language fluent Cranial nerve II-XII:Facial sensation and strength were normal. Uvula tongue midline. Head turning and shoulder shrug  were normal and symmetric.  Neck circumference 19 and half inches, Mallampati 3+ Motor: The motor testing reveals 5 over 5 strength of all 4 extremities. Good symmetric motor tone is noted throughout.  Sensory: Sensory testing is intact to soft touch on all 4 extremities. No evidence of extinction is noted.    Gait and station: Gait is normal.   DIAGNOSTIC DATA (LABS, IMAGING, TESTING) - I reviewed patient records, labs, notes, testing and imaging myself where available.  Lab Results  Component Value Date   WBC 13.1 (H) 02/14/2013   HGB 12.6 (L) 02/14/2013   HCT 37.5 (L) 02/14/2013   MCV 96.9 02/14/2013   PLT 187 02/14/2013      Component Value Date/Time   NA 136 02/14/2013 0534   K 4.6 02/14/2013 0534   CL 104 02/14/2013 0534   CO2 23 02/14/2013 0534   GLUCOSE 231 (H) 02/14/2013 0534   BUN 21 02/14/2013 0534   CREATININE 1.25 02/14/2013 0534   CALCIUM 8.4 02/14/2013 0534   GFRNONAA 60 (L) 02/14/2013 0534   GFRAA 69 (L) 02/14/2013 0534   No results found for: CHOL, HDL, LDLCALC, LDLDIRECT, TRIG, CHOLHDL No results found for: HGBA1C No results found for: VITAMINB12 No results found for: TSH    ASSESSMENT AND PLAN 68 y.o. year old male  has a past medical  history of Allergy, Arthritis, Chronic kidney disease, Constipation, DM (diabetes mellitus) ([place]), GERD (gastroesophageal reflux disease), Headache(784.0), Hemorrhoids, History of gout, HTN (hypertension), Hypercholesteremia, Hyperlipidemia, IBS (irritable bowel syndrome), Insomnia, Irritable bowel syndrome, Joint pain, Morbid obesity ([place]), Obstructive sleep apnea (adult) (pediatric), Osteoarthritis of right shoulder region (02/13/2013), Pain in limb, Peripheral edema, Pneumonia, Psoriasis, Shortness of breath, Sleep apnea, and Snoring (12/07/2012). here with:  1.  Obstructive sleep apnea on CPAP  The patient CPAP download shows good compliance.  I have ordered a new machine for the patient.  Have also requested that they send the straps with his new supplies.  I have advised that if his symptoms worsen or he develops new symptoms he should let us know.  He will follow-up in 6 months or sooner as needed.   I spent 15 minutes with the patient. 50% of this time was spent reviewing CPAP download   Ward Givens, MSN, NP-C 05/11/2018, 1:04 PM [facility] [address] [place], [place] [address] [phone]  I reviewed the above note and documentation by the Nurse Practitioner and agree with the history, physical exam, assessment and plan as outlined above. I was immediately available for face-to-face consultation. Star Age, MD, PhD [facility])

## 2018-05-11 NOTE — Progress Notes (Addendum)
Message sent via epic to [facility] re: new machine, settings, new supplies.  Received reply: Order has been sent into the office.

## 2018-05-11 NOTE — Patient Instructions (Signed)
Your Plan:  Continue using CPAP nightly and greater than 4 hours each night If your symptoms worsen or you develop new symptoms please let us know.   Please call [facility] at [phone], extension [phone] to discuss your concerns. Their customer service representatives will be glad to assist you. If they do not answer, please leave a message and they will call you back. Make sure to leave your name and return phone number. If you do not get a response from them in 3 business days, please let our office know.   Thank you for coming to see [place] at [facility]. I hope we have been able to provide you high quality care today.  You may receive a patient satisfaction survey over the next few weeks. We would appreciate your feedback and comments so that we may continue to improve ourselves and the health of our patients.

## 2018-07-26 ENCOUNTER — Telehealth: Payer: Self-pay | Admitting: Adult Health

## 2018-07-26 ENCOUNTER — Encounter: Payer: Self-pay | Admitting: Adult Health

## 2018-07-26 NOTE — Telephone Encounter (Signed)
Spoke to pt. He consented to VV webex. Due to current COVID 19 pandemic, our office is severely reducing in office visits for at least the next 2 weeks, in order to minimize the risk to our patients and healthcare providers.  Pt understands that although there may be some limitations with this type of visit, we will take all precautions to reduce any security or privacy concerns.  Pt understands that this will be treated like an in office visit and we will file with pt's insurance.  Pt's email is  [email or web address]. Pt understands that the [employer] webex software must be downloaded and operational on the device pt plans to use for the visit.  Chart updated.  Appt made 07-31-18 at 1300.

## 2018-07-26 NOTE — Telephone Encounter (Signed)
LMVM for pt to return call re: VV webex appt for cpap.

## 2018-07-26 NOTE — Telephone Encounter (Signed)
Pt wife has called stating no one told her or pt that there needed to be an initial CPAP f/u within 1st 90 days of using the devise. Wife states pt got the new cpap on 02-12. Wife asking what to do about appointment due to Covid-19.  Wife really does not want to take pt out unnecessarily. Pt wife made aware there is no DPR on file and that RN can call pt but not her  Please call pt to discuss

## 2018-07-31 ENCOUNTER — Encounter: Payer: Self-pay | Admitting: Family Medicine

## 2018-07-31 ENCOUNTER — Ambulatory Visit (INDEPENDENT_AMBULATORY_CARE_PROVIDER_SITE_OTHER): Payer: Medicare Other | Admitting: Family Medicine

## 2018-07-31 ENCOUNTER — Other Ambulatory Visit: Payer: Self-pay

## 2018-07-31 DIAGNOSIS — Z9989 Dependence on other enabling machines and devices: Secondary | ICD-10-CM

## 2018-07-31 DIAGNOSIS — G4733 Obstructive sleep apnea (adult) (pediatric): Secondary | ICD-10-CM | POA: Diagnosis not present

## 2018-07-31 NOTE — Progress Notes (Addendum)
PATIENT: Jamie Figueroa DOB: 1950/11/21  REASON FOR VISIT: follow up HISTORY FROM: patient  Virtual Visit via Telephone Note  I connected with Jamie Figueroa on 07/31/18 at  1:00 PM EDT by telephone and verified that I am speaking with the correct person using two identifiers.   I discussed the limitations, risks, security and privacy concerns of performing an evaluation and management service by telephone and the availability of in person appointments. I also discussed with the patient that there may be a patient responsible charge related to this service. The patient expressed understanding and agreed to proceed.   History of Present Illness:  07/31/18 Jamie Figueroa is a 68 y.o. male for follow up of OSA on CPAP. He reports doing well with CPAP therapy.  He has recently received his new CPAP machine.  He is using a nasal mask and reports that he is having difficulty getting his straps tight enough.  The following download reveals that he is having a significant leak.  He also mentions that he does not routinely sleep very long at night.  He states that if he gets 6 hours of sleep he is doing really well.  Otherwise he is doing well.  He does feel that he is sleeping better with his new machine.    05/23/2018 - 06/21/2018 Initial compliance period 05/23/2018 - 06/21/2018 Compliance met Yes Compliance percentage 80% Usage 05/23/2018 - 06/21/2018 Usage days 27/30 days (90%) >= 4 hours 24 days (80%) < 4 hours 3 days (10%) Usage hours 141 hours 46 minutes Average usage (total days) 4 hours 44 minutes Average usage (days used) 5 hours 15 minutes Median usage (days used) 5 hours 39 minutes Total used hours (value since last reset - 06/21/2018) 141 hours AirSense 10 AutoSet Serial number [id number] Mode CPAP Set pressure 11 cmH2O EPR Fulltime EPR level 3 Therapy Leaks - L/min Median: 8.0 95th percentile: 41.6 Maximum: 57.9 Events per hour AI: 3.7 HI: 0.2 AHI: 3.9 Apnea Index  Central: 1.7 Obstructive: 1.9 Unknown: 0.1 RERA Index 0.0 Cheyne-Stokes respiration (average duration per night) 1 minutes (0%)   HISTORY (copied from [employer] note on 05/11/2018)  Mr. Jamie Figueroa is a 68 year old male with a history of obstructive sleep apnea on CPAP.  He returns today for follow-up.  His CPAP download indicates that he uses machine 24 out of 30 days for compliance of 80%.  He uses machine greater than 4 hours each night.  On average he uses his machine 6 hours and 2 minutes.  His residual AHI is 3.7 on 11 cm of water with EPR of 3.  His leak in the 95th percentile is 39.2.  He states that every time they send new supplies they do not replace his straps.  The patient would like a new machine as long as he qualifies.    HISTORY 05/10/2017: I reviewed his CPAP compliance data from 04/10/2017 through 05/09/2017 which is a total of 30 days, during which time he used his machine 27 days with percent used days greater than 4 hours at 83%, indicating very good compliance with an average usage of 5 hours and 58 minutes, residual AHI borderline at 5.5 per hour, leaked high with the 95thpercentile at 38 L/m on a pressure of 11 cm with EPR of 3. He reports doing well, CPAP is going well for him.But he does need new supplies including the headgear which tends to stretch out. He may not have received a head year in a year.  He has not lost a lot of weight, weight seems to fluctuate. He does not always drink enough water he admits. He likes to drink diet sodas.   Observations/Objective:  Generalized: Well developed, in no acute distress  Mentation: Alert oriented to time, place, history taking. Follows all commands speech and language fluent   Assessment and Plan:  68 y.o. year old male  has a past medical history of Allergy, Arthritis, Chronic kidney disease, Constipation, DM (diabetes mellitus) ([place]), GERD (gastroesophageal reflux disease), Headache(784.0), Hemorrhoids, History of  gout, HTN (hypertension), Hypercholesteremia, Hyperlipidemia, IBS (irritable bowel syndrome), Insomnia, Irritable bowel syndrome, Joint pain, Morbid obesity ([place]), Obstructive sleep apnea (adult) (pediatric), Osteoarthritis of right shoulder region (02/13/2013), Pain in limb, Peripheral edema, Pneumonia, Psoriasis, Shortness of breath, Sleep apnea, and Snoring (12/07/2012).  with    ICD-10-CM   1. OSA on CPAP G47.33 For home use only DME continuous positive airway pressure (CPAP)   Z99.89    He is doing well with CPAP therapy.  We have discussed the importance of using CPAP every night and for greater than 4 hours each night.  Complications from untreated sleep apnea discussed.  I have encouraged him to work on using his machine for greater than 4 hours.  We will also place an order for a mask refitting as he does not feel that his straps are tight enough.  I have advised a six-month follow-up, may consider annual follow-ups at that point if sleep is improved.  Patient verbalizes understanding and agreement with this plan.  Orders Placed This Encounter  Procedures  . For home use only DME continuous positive airway pressure (CPAP)    Needs mask refitting, feels straps are not tight enough    Order Specific Question:   Patient has OSA or probable OSA    Answer:   Yes    Order Specific Question:   Is the patient currently using CPAP in the home    Answer:   Yes    Order Specific Question:   Settings    Answer:   Other see comments    Order Specific Question:   CPAP supplies needed    Answer:   Mask, headgear, cushions, filters, heated tubing and water chamber    No orders of the defined types were placed in this encounter.    Follow Up Instructions:  I discussed the assessment and treatment plan with the patient. The patient was provided an opportunity to ask questions and all were answered. The patient agreed with the plan and demonstrated an understanding of the instructions.   The  patient was advised to call back or seek an in-person evaluation if the symptoms worsen or if the condition fails to improve as anticipated.  I provided 20 minutes of non-face-to-face time during this encounter.  Patient is located in his legs during video conference.  Provider is located at her place of residence.  Liane Comber, RN help to facilitate visit.   Debbora Presto, NP   I reviewed the above note and documentation by the Nurse Practitioner and agree with the history, assessment and plan as outlined above. I was immediately available for a phone consultation. Star Age, MD, PhD [facility])

## 2019-05-07 ENCOUNTER — Ambulatory Visit: Payer: Medicare PPO | Attending: Internal Medicine

## 2019-05-07 DIAGNOSIS — Z23 Encounter for immunization: Secondary | ICD-10-CM

## 2019-05-07 NOTE — Progress Notes (Signed)
   Covid-19 Vaccination Clinic  Name:  TESLA QIAN    MRN: [id number] DOB: 1950-09-26  05/07/2019  Mr. Gambale was observed post Covid-19 immunization for 15 minutes without incidence. He was provided with Vaccine Information Sheet and instruction to access the V-Safe system.   Mr. Hudelson was instructed to call 911 with any severe reactions post vaccine: Marland Kitchen Difficulty breathing  . Swelling of your face and throat  . A fast heartbeat  . A bad rash all over your body  . Dizziness and weakness    Immunizations Administered    Name Date Dose VIS Date Route   Pfizer COVID-19 Vaccine 05/07/2019 10:34 AM 0.3 mL 03/23/2019 Intramuscular   Manufacturer: [place]   Lot: [id number]   [place]: [id number]

## 2019-05-24 ENCOUNTER — Ambulatory Visit: Payer: Medicare PPO | Admitting: Adult Health

## 2019-05-24 ENCOUNTER — Other Ambulatory Visit: Payer: Self-pay

## 2019-05-24 ENCOUNTER — Telehealth: Payer: Self-pay

## 2019-05-24 ENCOUNTER — Encounter: Payer: Self-pay | Admitting: Adult Health

## 2019-05-24 VITALS — BP 124/68 | HR 74 | Temp 96.6°F | Ht 70.0 in | Wt 266.8 lb

## 2019-05-24 DIAGNOSIS — G4733 Obstructive sleep apnea (adult) (pediatric): Secondary | ICD-10-CM

## 2019-05-24 DIAGNOSIS — Z9989 Dependence on other enabling machines and devices: Secondary | ICD-10-CM | POA: Diagnosis not present

## 2019-05-24 NOTE — Progress Notes (Signed)
PATIENT: Jamie Figueroa DOB: May 03, 1950  REASON FOR VISIT: follow up HISTORY FROM: patient  HISTORY OF PRESENT ILLNESS: Today 05/24/19:  Jamie Figueroa is a 69 year old male with a history of obstructive sleep apnea on CPAP.  He returns today for follow-up.  His download indicates that he uses machine 20 out of 30 days for compliance of 67%.  He uses machine greater than 4 hours 17 days for compliance of 57%.  On average he uses his machine 5 hours and 2 minutes.  His residual AHI is 2.5 on 11 cm of water with EPR 3.  His leak in the 95th percentile is 34 L/min.  He reports that the last visit he never got a call from his DME company to have a mask refitting.  He states that most nights he is unable to use the machine continuously due to the leak.  He returns today for an evaluation.  HISTORY 07/31/18 Jamie Figueroa is a 69 y.o. male for follow up of OSA on CPAP. He reports doing well with CPAP therapy.  He has recently received his new CPAP machine.  He is using a nasal mask and reports that he is having difficulty getting his straps tight enough.  The following download reveals that he is having a significant leak.  He also mentions that he does not routinely sleep very long at night.  He states that if he gets 6 hours of sleep he is doing really well.  Otherwise he is doing well.  He does feel that he is sleeping better with his new machine.    REVIEW OF SYSTEMS: Out of a complete 14 system review of symptoms, the patient complains only of the following symptoms, and all other reviewed systems are negative.  See HPI  ALLERGIES: No Known Allergies  HOME MEDICATIONS: Outpatient Medications Prior to Visit  Medication Sig Dispense Refill  . aspirin 81 MG tablet Take 81 mg by mouth daily.    . Choline Fenofibrate (FENOFIBRIC ACID) 135 MG CPDR Take 1 tablet by mouth daily.     . furosemide (LASIX) 40 MG tablet Take 40 mg by mouth daily.     Marland Kitchen gabapentin (NEURONTIN) 100 MG capsule TAKE ONE  CAPSULE BY MOUTH 1 TIME A DAY AS NEEDED  0  . HUMALOG KWIKPEN 100 UNIT/ML KiwkPen INJECT 16 UNITS WITH BIGGEST MEAL  3  . INVOKANA 300 MG TABS tablet Take 300 mg by mouth daily.  6  . LEVEMIR FLEXTOUCH 100 UNIT/ML Pen INJECT 50 UNITS TWICE DAILY  2  . Melatonin 10 MG TABS Take 1 tablet by mouth daily.    Marland Kitchen NOVOTWIST 32G X 5 MM MISC Pt uses 5 needles daily    . ONE TOUCH ULTRA TEST test strip     . POTASSIUM GLUCONATE PO Take 1 tablet by mouth daily as needed. Reported on 10/07/2015    . ramipril (ALTACE) 10 MG capsule Take 10 mg by mouth daily.     Marland Kitchen VICTOZA 18 MG/3ML SOPN Inject 1.8 mg into the skin daily.     Marland Kitchen VYTORIN 10-40 MG per tablet Take 1 tablet by mouth daily.      No facility-administered medications prior to visit.    PAST MEDICAL HISTORY: Past Medical History:  Diagnosis Date  . Allergy   . Arthritis    left shoulder and knees   . Chronic kidney disease    kidney stones  . Constipation    occasionally   . DM (diabetes  mellitus) ([place])    takes Victoza daily;Humalog and Levemir daily  . GERD (gastroesophageal reflux disease)    but doesn't take any meds  . Headache(784.0)    occasionally to rare   . Hemorrhoids   . History of gout   . HTN (hypertension)    takes Ramipril daily  . Hypercholesteremia   . Hyperlipidemia    takes Vytorin daily  . IBS (irritable bowel syndrome)   . Insomnia    doesn't require meds  . Irritable bowel syndrome   . Joint pain   . Morbid obesity ([place])   . Obstructive sleep apnea (adult) (pediatric)    wears cpap   . Osteoarthritis of right shoulder region 02/13/2013  . Pain in limb   . Peripheral edema    takes Furosemide daily  . Pneumonia    hx of;about 11yrs ago  . Psoriasis   . Shortness of breath    with exertion  . Sleep apnea   . Snoring 12/07/2012    PAST SURGICAL HISTORY: Past Surgical History:  Procedure Laterality Date  . ANKLE SURGERY Right   . COLONOSCOPY  [id number]  . ESOPHAGOGASTRODUODENOSCOPY    . KNEE  SURGERY Bilateral   . TOTAL SHOULDER ARTHROPLASTY Right 02/13/2013   Procedure: TOTAL SHOULDER ARTHROPLASTY;  Surgeon: Johnny Bridge, MD;  Location: [facility];  Service: Orthopedics;  Laterality: Right;  . TOTAL SHOULDER REPLACEMENT Right 02/13/2013   Dr Mardelle Matte    FAMILY HISTORY: Family History  Problem Relation Age of Onset  . Diabetes Mother   . Colon cancer Neg Hx   . Colon polyps Neg Hx   . Esophageal cancer Neg Hx   . Rectal cancer Neg Hx   . Stomach cancer Neg Hx     SOCIAL HISTORY: Social History   Socioeconomic History  . Marital status: Married    Spouse name: Not on file  . Number of children: Not on file  . Years of education: Not on file  . Highest education level: Not on file  Occupational History  . Not on file  Tobacco Use  . Smoking status: Never Smoker  . Smokeless tobacco: Never Used  Substance and Sexual Activity  . Alcohol use: Yes    Alcohol/week: 0.0 standard drinks    Comment: occasionally beer  . Drug use: No  . Sexual activity: Yes  Other Topics Concern  . Not on file  Social History Narrative  . Not on file   Social Determinants of Health   Financial Resource Strain:   . Difficulty of Paying Living Expenses: Not on file  Food Insecurity:   . Worried About Charity fundraiser in the Last Year: Not on file  . Ran Out of Food in the Last Year: Not on file  Transportation Needs:   . Lack of Transportation (Medical): Not on file  . Lack of Transportation (Non-Medical): Not on file  Physical Activity:   . Days of Exercise per Week: Not on file  . Minutes of Exercise per Session: Not on file  Stress:   . Feeling of Stress : Not on file  Social Connections:   . Frequency of Communication with Friends and Family: Not on file  . Frequency of Social Gatherings with Friends and Family: Not on file  . Attends Religious Services: Not on file  . Active Member of Clubs or Organizations: Not on file  . Attends Archivist Meetings: Not on  file  . Marital Status: Not on file  Intimate Partner Violence:   . Fear of Current or Ex-Partner: Not on file  . Emotionally Abused: Not on file  . Physically Abused: Not on file  . Sexually Abused: Not on file      PHYSICAL EXAM  Vitals:   05/24/19 0908  BP: 124/68  Pulse: 74  Temp: (!) 96.6 F (35.9 C)  TempSrc: Oral  Weight: 266 lb 12.8 oz (121 kg)  Height: 5\' 10"  (1.778 m)   Body mass index is 38.28 kg/m.  Generalized: Well developed, in no acute distress  Chest: Lungs clear to auscultation bilaterally  Neurological examination  Mentation: Alert oriented to time, place, history taking. Follows all commands speech and language fluent Cranial nerve II-XII: Extraocular movements were full, visual field were full on confrontational test Head turning and shoulder shrug  were normal and symmetric. Motor: The motor testing reveals 5 over 5 strength of all 4 extremities. Good symmetric motor tone is noted throughout.  Sensory: Sensory testing is intact to soft touch on all 4 extremities. No evidence of extinction is noted.  Gait and station: Gait is normal.    DIAGNOSTIC DATA (LABS, IMAGING, TESTING) - I reviewed patient records, labs, notes, testing and imaging myself where available.  Lab Results  Component Value Date   WBC 13.1 (H) 02/14/2013   HGB 12.6 (L) 02/14/2013   HCT 37.5 (L) 02/14/2013   MCV 96.9 02/14/2013   PLT 187 02/14/2013      Component Value Date/Time   NA 136 02/14/2013 0534   K 4.6 02/14/2013 0534   CL 104 02/14/2013 0534   CO2 23 02/14/2013 0534   GLUCOSE 231 (H) 02/14/2013 0534   BUN 21 02/14/2013 0534   CREATININE 1.25 02/14/2013 0534   CALCIUM 8.4 02/14/2013 0534   GFRNONAA 60 (L) 02/14/2013 0534   GFRAA 69 (L) 02/14/2013 0534      ASSESSMENT AND PLAN 69 y.o. year old male  has a past medical history of Allergy, Arthritis, Chronic kidney disease, Constipation, DM (diabetes mellitus) ([place]), GERD (gastroesophageal reflux disease),  Headache(784.0), Hemorrhoids, History of gout, HTN (hypertension), Hypercholesteremia, Hyperlipidemia, IBS (irritable bowel syndrome), Insomnia, Irritable bowel syndrome, Joint pain, Morbid obesity ([place]), Obstructive sleep apnea (adult) (pediatric), Osteoarthritis of right shoulder region (02/13/2013), Pain in limb, Peripheral edema, Pneumonia, Psoriasis, Shortness of breath, Sleep apnea, and Snoring (12/07/2012). here with:  1: OSA on CPAP  -Suboptimal compliance-encouraged to use nightly and greater than 4 hours each night -Good treatment of apnea when using -High leak we will order a mask refitting -Follow-up in 6 months or sooner if needed   I spent 15 minutes with the patient reviewing CPAP download   Ward Givens, MSN, NP-C 05/24/2019, 9:45 AM [facility] [address] [place], [place] [address] [phone]

## 2019-05-24 NOTE — Telephone Encounter (Signed)
Sent orders per NP MM request for pt to have a mask refitting via [employer].  Awaiting confirmation. [facility])

## 2019-05-24 NOTE — Telephone Encounter (Signed)
Received confirmation via community message 

## 2019-05-24 NOTE — Patient Instructions (Signed)
Continue using CPAP nightly and greater than 4 hours each night mask refitting ordered  If your symptoms worsen or you develop new symptoms please let us know.

## 2019-05-28 ENCOUNTER — Ambulatory Visit: Payer: Medicare PPO | Attending: Internal Medicine

## 2019-05-28 DIAGNOSIS — Z23 Encounter for immunization: Secondary | ICD-10-CM | POA: Insufficient documentation

## 2019-05-28 NOTE — Progress Notes (Signed)
   Covid-19 Vaccination Clinic  Name:  Jamie Figueroa    MRN: [id number] DOB: 1951-03-26  05/28/2019  [name] was observed post Covid-19 immunization for 15 minutes without incidence. He was provided with Vaccine Information Sheet and instruction to access the V-Safe system.   [name] was instructed to call 911 with any severe reactions post vaccine: Marland Kitchen Difficulty breathing  . Swelling of your face and throat  . A fast heartbeat  . A bad rash all over your body  . Dizziness and weakness    Immunizations Administered    Name Date Dose VIS Date Route   Pfizer COVID-19 Vaccine 05/28/2019 10:40 AM 0.3 mL 03/23/2019 Intramuscular   Manufacturer: [place]   Lot: [id number]   [place]: [id number]

## 2019-08-15 ENCOUNTER — Other Ambulatory Visit: Payer: Self-pay | Admitting: Internal Medicine

## 2019-08-15 DIAGNOSIS — R1310 Dysphagia, unspecified: Secondary | ICD-10-CM

## 2019-08-22 ENCOUNTER — Ambulatory Visit
Admission: RE | Admit: 2019-08-22 | Discharge: 2019-08-22 | Disposition: A | Discharge status: Home / Self Care | Payer: Medicare PPO | Source: Ambulatory Visit | Attending: Internal Medicine | Admitting: Internal Medicine

## 2019-08-22 DIAGNOSIS — R1310 Dysphagia, unspecified: Secondary | ICD-10-CM

## 2019-10-21 DIAGNOSIS — G4733 Obstructive sleep apnea (adult) (pediatric): Secondary | ICD-10-CM | POA: Diagnosis not present

## 2019-11-21 ENCOUNTER — Ambulatory Visit: Payer: Medicare PPO | Admitting: Adult Health

## 2019-11-21 VITALS — BP 149/73 | HR 67 | Ht 70.0 in | Wt 266.0 lb

## 2019-11-21 DIAGNOSIS — Z9989 Dependence on other enabling machines and devices: Secondary | ICD-10-CM

## 2019-11-21 DIAGNOSIS — G4733 Obstructive sleep apnea (adult) (pediatric): Secondary | ICD-10-CM | POA: Diagnosis not present

## 2019-11-21 NOTE — Progress Notes (Signed)
PATIENT: Jamie Figueroa DOB: 01/19/1951  REASON FOR VISIT: follow up HISTORY FROM: patient  HISTORY OF PRESENT ILLNESS: Today 11/21/19:  Jamie Figueroa  is a 69 year old male with a history of obstructive sleep apnea on CPAP.  He returns today for follow-up.  His download indicates that he use his machine 13 out of 30 days for compliance of 43%.  He uses machine greater than 4 hours each night.  On average he uses it 4 hours and 57 minutes.  His residual AHI is 3.1 on 11 cm of water with EPR of 3.  He reports that there are some nights that he forgets to put his machine on and falls asleep.  He also states that they went on vacation for a week and he did not take it with him.  HISTORY 05/24/19:  Jamie Figueroa is a 69 year old male with a history of obstructive sleep apnea on CPAP.  He returns today for follow-up.  His download indicates that he uses machine 20 out of 30 days for compliance of 67%.  He uses machine greater than 4 hours 17 days for compliance of 57%.  On average he uses his machine 5 hours and 2 minutes.  His residual AHI is 2.5 on 11 cm of water with EPR 3.  His leak in the 95th percentile is 34 L/min.  He reports that the last visit he never got a call from his DME company to have a mask refitting.  He states that most nights he is unable to use the machine continuously due to the leak.  He returns today for an evaluation.  REVIEW OF SYSTEMS: Out of a complete 14 system review of symptoms, the patient complains only of the following symptoms, and all other reviewed systems are negative.  FSS 24 ESS 3  ALLERGIES: No Known Allergies  HOME MEDICATIONS: Outpatient Medications Prior to Visit  Medication Sig Dispense Refill  . aspirin 81 MG tablet Take 81 mg by mouth daily.    . Choline Fenofibrate (FENOFIBRIC ACID) 135 MG CPDR Take 1 tablet by mouth daily.     . furosemide (LASIX) 40 MG tablet Take 40 mg by mouth daily.     Marland Kitchen gabapentin (NEURONTIN) 100 MG capsule TAKE ONE  CAPSULE BY MOUTH 1 TIME A DAY AS NEEDED  0  . HUMALOG KWIKPEN 100 UNIT/ML KiwkPen INJECT 16 UNITS WITH BIGGEST MEAL  3  . INVOKANA 300 MG TABS tablet Take 300 mg by mouth daily.  6  . LEVEMIR FLEXTOUCH 100 UNIT/ML Pen INJECT 50 UNITS TWICE DAILY  2  . Melatonin 10 MG TABS Take 1 tablet by mouth daily.    Marland Kitchen NOVOTWIST 32G X 5 MM MISC Pt uses 5 needles daily    . ONE TOUCH ULTRA TEST test strip     . POTASSIUM GLUCONATE PO Take 1 tablet by mouth daily as needed. Reported on 10/07/2015    . ramipril (ALTACE) 10 MG capsule Take 10 mg by mouth daily.     . Semaglutide,0.25 or 0.5MG /DOS, (OZEMPIC, 0.25 OR 0.5 MG/DOSE,) 2 MG/1.5ML SOPN Inject into the skin.    Marland Kitchen VYTORIN 10-40 MG per tablet Take 1 tablet by mouth daily.     Marland Kitchen VICTOZA 18 MG/3ML SOPN Inject 1.8 mg into the skin daily.      No facility-administered medications prior to visit.    PAST MEDICAL HISTORY: Past Medical History:  Diagnosis Date  . Allergy   . Arthritis    left shoulder and  knees   . Chronic kidney disease    kidney stones  . Constipation    occasionally   . DM (diabetes mellitus) ([place])    takes Victoza daily;Humalog and Levemir daily  . GERD (gastroesophageal reflux disease)    but doesn't take any meds  . Headache(784.0)    occasionally to rare   . Hemorrhoids   . History of gout   . HTN (hypertension)    takes Ramipril daily  . Hypercholesteremia   . Hyperlipidemia    takes Vytorin daily  . IBS (irritable bowel syndrome)   . Insomnia    doesn't require meds  . Irritable bowel syndrome   . Joint pain   . Morbid obesity ([place])   . Obstructive sleep apnea (adult) (pediatric)    wears cpap   . Osteoarthritis of right shoulder region 02/13/2013  . Pain in limb   . Peripheral edema    takes Furosemide daily  . Pneumonia    hx of;about 84yrs ago  . Psoriasis   . Shortness of breath    with exertion  . Sleep apnea   . Snoring 12/07/2012    PAST SURGICAL HISTORY: Past Surgical History:  Procedure  Laterality Date  . ANKLE SURGERY Right   . COLONOSCOPY  [id number]  . ESOPHAGOGASTRODUODENOSCOPY    . KNEE SURGERY Bilateral   . TOTAL SHOULDER ARTHROPLASTY Right 02/13/2013   Procedure: TOTAL SHOULDER ARTHROPLASTY;  Surgeon: Johnny Bridge, MD;  Location: [facility];  Service: Orthopedics;  Laterality: Right;  . TOTAL SHOULDER REPLACEMENT Right 02/13/2013   Dr Mardelle Matte    FAMILY HISTORY: Family History  Problem Relation Age of Onset  . Diabetes Mother   . Colon cancer Neg Hx   . Colon polyps Neg Hx   . Esophageal cancer Neg Hx   . Rectal cancer Neg Hx   . Stomach cancer Neg Hx     SOCIAL HISTORY: Social History   Socioeconomic History  . Marital status: Married    Spouse name: Not on file  . Number of children: Not on file  . Years of education: Not on file  . Highest education level: Not on file  Occupational History  . Not on file  Tobacco Use  . Smoking status: Never Smoker  . Smokeless tobacco: Never Used  Substance and Sexual Activity  . Alcohol use: Yes    Alcohol/week: 0.0 standard drinks    Comment: occasionally beer  . Drug use: No  . Sexual activity: Yes  Other Topics Concern  . Not on file  Social History Narrative  . Not on file   Social Determinants of Health   Financial Resource Strain:   . Difficulty of Paying Living Expenses:   Food Insecurity:   . Worried About Charity fundraiser in the Last Year:   . Arboriculturist in the Last Year:   Transportation Needs:   . Film/video editor (Medical):   Marland Kitchen Lack of Transportation (Non-Medical):   Physical Activity:   . Days of Exercise per Week:   . Minutes of Exercise per Session:   Stress:   . Feeling of Stress :   Social Connections:   . Frequency of Communication with Friends and Family:   . Frequency of Social Gatherings with Friends and Family:   . Attends Religious Services:   . Active Member of Clubs or Organizations:   . Attends Archivist Meetings:   Marland Kitchen Marital Status:     Intimate Production manager  Violence:   . Fear of Current or Ex-Partner:   . Emotionally Abused:   Marland Kitchen Physically Abused:   . Sexually Abused:       PHYSICAL EXAM  Vitals:   11/21/19 0928  BP: (!) 149/73  Pulse: 67  Weight: 266 lb (120.7 kg)  Height: 5\' 10"  (1.778 m)   Body mass index is 38.17 kg/m.  Generalized: Well developed, in no acute distress  Chest: Lungs clear to auscultation bilaterally  Neurological examination  Mentation: Alert oriented to time, place, history taking. Follows all commands speech and language fluent Cranial nerve II-XII: Extraocular movements were full, visual field were full on confrontational test Head turning and shoulder shrug  were normal and symmetric. Motor: The motor testing reveals 5 over 5 strength of all 4 extremities. Good symmetric motor tone is noted throughout.  Sensory: Sensory testing is intact to soft touch on all 4 extremities. No evidence of extinction is noted.  Gait and station: Gait is normal.    DIAGNOSTIC DATA (LABS, IMAGING, TESTING) - I reviewed patient records, labs, notes, testing and imaging myself where available.  Lab Results  Component Value Date   WBC 13.1 (H) 02/14/2013   HGB 12.6 (L) 02/14/2013   HCT 37.5 (L) 02/14/2013   MCV 96.9 02/14/2013   PLT 187 02/14/2013      Component Value Date/Time   NA 136 02/14/2013 0534   K 4.6 02/14/2013 0534   CL 104 02/14/2013 0534   CO2 23 02/14/2013 0534   GLUCOSE 231 (H) 02/14/2013 0534   BUN 21 02/14/2013 0534   CREATININE 1.25 02/14/2013 0534   CALCIUM 8.4 02/14/2013 0534   GFRNONAA 60 (L) 02/14/2013 0534   GFRAA 69 (L) 02/14/2013 0534      ASSESSMENT AND PLAN 69 y.o. year old male  has a past medical history of Allergy, Arthritis, Chronic kidney disease, Constipation, DM (diabetes mellitus) ([place]), GERD (gastroesophageal reflux disease), Headache(784.0), Hemorrhoids, History of gout, HTN (hypertension), Hypercholesteremia, Hyperlipidemia, IBS (irritable bowel  syndrome), Insomnia, Irritable bowel syndrome, Joint pain, Morbid obesity ([place]), Obstructive sleep apnea (adult) (pediatric), Osteoarthritis of right shoulder region (02/13/2013), Pain in limb, Peripheral edema, Pneumonia, Psoriasis, Shortness of breath, Sleep apnea, and Snoring (12/07/2012). here with:  1. OSA on CPAP  - CPAP compliance is suboptimal - Good treatment of AHI  - Encourage patient to use CPAP nightly and > 4 hours each night - F/U in 1 year or sooner if needed   I spent 25 minutes of face-to-face and non-face-to-face time with patient.  This included previsit chart review, lab review, study review, order entry, electronic health record documentation, patient education.  Ward Givens, MSN, NP-C 11/21/2019, 9:36 AM [facility] [address] [place], [place] [address] [phone]

## 2019-12-22 DIAGNOSIS — G4733 Obstructive sleep apnea (adult) (pediatric): Secondary | ICD-10-CM | POA: Diagnosis not present

## 2020-01-21 DIAGNOSIS — G4733 Obstructive sleep apnea (adult) (pediatric): Secondary | ICD-10-CM | POA: Diagnosis not present

## 2020-01-25 DIAGNOSIS — G4733 Obstructive sleep apnea (adult) (pediatric): Secondary | ICD-10-CM | POA: Diagnosis not present

## 2020-01-28 DIAGNOSIS — H5211 Myopia, right eye: Secondary | ICD-10-CM | POA: Diagnosis not present

## 2020-01-28 DIAGNOSIS — H2513 Age-related nuclear cataract, bilateral: Secondary | ICD-10-CM | POA: Diagnosis not present

## 2020-01-28 DIAGNOSIS — E119 Type 2 diabetes mellitus without complications: Secondary | ICD-10-CM | POA: Diagnosis not present

## 2020-02-18 DIAGNOSIS — D2362 Other benign neoplasm of skin of left upper limb, including shoulder: Secondary | ICD-10-CM | POA: Diagnosis not present

## 2020-02-18 DIAGNOSIS — D692 Other nonthrombocytopenic purpura: Secondary | ICD-10-CM | POA: Diagnosis not present

## 2020-02-18 DIAGNOSIS — L821 Other seborrheic keratosis: Secondary | ICD-10-CM | POA: Diagnosis not present

## 2020-02-18 DIAGNOSIS — D1801 Hemangioma of skin and subcutaneous tissue: Secondary | ICD-10-CM | POA: Diagnosis not present

## 2020-02-18 DIAGNOSIS — C44319 Basal cell carcinoma of skin of other parts of face: Secondary | ICD-10-CM | POA: Diagnosis not present

## 2020-02-18 DIAGNOSIS — L570 Actinic keratosis: Secondary | ICD-10-CM | POA: Diagnosis not present

## 2020-02-18 DIAGNOSIS — Z85828 Personal history of other malignant neoplasm of skin: Secondary | ICD-10-CM | POA: Diagnosis not present

## 2020-02-19 DIAGNOSIS — D692 Other nonthrombocytopenic purpura: Secondary | ICD-10-CM | POA: Diagnosis not present

## 2020-02-19 DIAGNOSIS — I129 Hypertensive chronic kidney disease with stage 1 through stage 4 chronic kidney disease, or unspecified chronic kidney disease: Secondary | ICD-10-CM | POA: Diagnosis not present

## 2020-02-19 DIAGNOSIS — E1165 Type 2 diabetes mellitus with hyperglycemia: Secondary | ICD-10-CM | POA: Diagnosis not present

## 2020-02-19 DIAGNOSIS — E1129 Type 2 diabetes mellitus with other diabetic kidney complication: Secondary | ICD-10-CM | POA: Diagnosis not present

## 2020-02-19 DIAGNOSIS — N1831 Chronic kidney disease, stage 3a: Secondary | ICD-10-CM | POA: Diagnosis not present

## 2020-02-19 DIAGNOSIS — Z794 Long term (current) use of insulin: Secondary | ICD-10-CM | POA: Diagnosis not present

## 2020-02-19 DIAGNOSIS — E7800 Pure hypercholesterolemia, unspecified: Secondary | ICD-10-CM | POA: Diagnosis not present

## 2020-02-19 DIAGNOSIS — Z23 Encounter for immunization: Secondary | ICD-10-CM | POA: Diagnosis not present

## 2020-02-21 DIAGNOSIS — G4733 Obstructive sleep apnea (adult) (pediatric): Secondary | ICD-10-CM | POA: Diagnosis not present

## 2020-03-22 DIAGNOSIS — G4733 Obstructive sleep apnea (adult) (pediatric): Secondary | ICD-10-CM | POA: Diagnosis not present

## 2020-04-09 DIAGNOSIS — M170 Bilateral primary osteoarthritis of knee: Secondary | ICD-10-CM | POA: Diagnosis not present

## 2020-04-09 DIAGNOSIS — S5002XA Contusion of left elbow, initial encounter: Secondary | ICD-10-CM | POA: Diagnosis not present

## 2020-04-22 DIAGNOSIS — G4733 Obstructive sleep apnea (adult) (pediatric): Secondary | ICD-10-CM | POA: Diagnosis not present

## 2020-04-24 DIAGNOSIS — G4733 Obstructive sleep apnea (adult) (pediatric): Secondary | ICD-10-CM | POA: Diagnosis not present

## 2020-07-28 DIAGNOSIS — G4733 Obstructive sleep apnea (adult) (pediatric): Secondary | ICD-10-CM | POA: Diagnosis not present

## 2020-08-11 DIAGNOSIS — E1165 Type 2 diabetes mellitus with hyperglycemia: Secondary | ICD-10-CM | POA: Diagnosis not present

## 2020-08-11 DIAGNOSIS — E7800 Pure hypercholesterolemia, unspecified: Secondary | ICD-10-CM | POA: Diagnosis not present

## 2020-08-11 DIAGNOSIS — Z125 Encounter for screening for malignant neoplasm of prostate: Secondary | ICD-10-CM | POA: Diagnosis not present

## 2020-08-18 DIAGNOSIS — I129 Hypertensive chronic kidney disease with stage 1 through stage 4 chronic kidney disease, or unspecified chronic kidney disease: Secondary | ICD-10-CM | POA: Diagnosis not present

## 2020-08-18 DIAGNOSIS — N1831 Chronic kidney disease, stage 3a: Secondary | ICD-10-CM | POA: Diagnosis not present

## 2020-08-18 DIAGNOSIS — L821 Other seborrheic keratosis: Secondary | ICD-10-CM | POA: Diagnosis not present

## 2020-08-18 DIAGNOSIS — Z0000 Encounter for general adult medical examination without abnormal findings: Secondary | ICD-10-CM | POA: Diagnosis not present

## 2020-08-18 DIAGNOSIS — L570 Actinic keratosis: Secondary | ICD-10-CM | POA: Diagnosis not present

## 2020-08-18 DIAGNOSIS — D1801 Hemangioma of skin and subcutaneous tissue: Secondary | ICD-10-CM | POA: Diagnosis not present

## 2020-08-18 DIAGNOSIS — I517 Cardiomegaly: Secondary | ICD-10-CM | POA: Diagnosis not present

## 2020-08-18 DIAGNOSIS — E1129 Type 2 diabetes mellitus with other diabetic kidney complication: Secondary | ICD-10-CM | POA: Diagnosis not present

## 2020-08-18 DIAGNOSIS — D485 Neoplasm of uncertain behavior of skin: Secondary | ICD-10-CM | POA: Diagnosis not present

## 2020-08-18 DIAGNOSIS — D2362 Other benign neoplasm of skin of left upper limb, including shoulder: Secondary | ICD-10-CM | POA: Diagnosis not present

## 2020-08-18 DIAGNOSIS — Z85828 Personal history of other malignant neoplasm of skin: Secondary | ICD-10-CM | POA: Diagnosis not present

## 2020-08-18 DIAGNOSIS — Z1339 Encounter for screening examination for other mental health and behavioral disorders: Secondary | ICD-10-CM | POA: Diagnosis not present

## 2020-08-18 DIAGNOSIS — Z794 Long term (current) use of insulin: Secondary | ICD-10-CM | POA: Diagnosis not present

## 2020-08-18 DIAGNOSIS — Z1212 Encounter for screening for malignant neoplasm of rectum: Secondary | ICD-10-CM | POA: Diagnosis not present

## 2020-08-18 DIAGNOSIS — Z1331 Encounter for screening for depression: Secondary | ICD-10-CM | POA: Diagnosis not present

## 2020-08-18 DIAGNOSIS — D692 Other nonthrombocytopenic purpura: Secondary | ICD-10-CM | POA: Diagnosis not present

## 2020-08-18 DIAGNOSIS — E1165 Type 2 diabetes mellitus with hyperglycemia: Secondary | ICD-10-CM | POA: Diagnosis not present

## 2020-08-18 DIAGNOSIS — R82998 Other abnormal findings in urine: Secondary | ICD-10-CM | POA: Diagnosis not present

## 2020-08-28 ENCOUNTER — Other Ambulatory Visit: Payer: Self-pay

## 2020-08-28 ENCOUNTER — Encounter (HOSPITAL_BASED_OUTPATIENT_CLINIC_OR_DEPARTMENT_OTHER): Payer: Self-pay | Admitting: Emergency Medicine

## 2020-08-28 ENCOUNTER — Emergency Department (HOSPITAL_BASED_OUTPATIENT_CLINIC_OR_DEPARTMENT_OTHER): Payer: Medicare PPO

## 2020-08-28 ENCOUNTER — Emergency Department (HOSPITAL_BASED_OUTPATIENT_CLINIC_OR_DEPARTMENT_OTHER)
Admission: EM | Admit: 2020-08-28 | Discharge: 2020-08-28 | Disposition: A | Discharge status: Home / Self Care | Payer: Medicare PPO | Attending: Emergency Medicine | Admitting: Emergency Medicine

## 2020-08-28 DIAGNOSIS — Z79899 Other long term (current) drug therapy: Secondary | ICD-10-CM | POA: Diagnosis not present

## 2020-08-28 DIAGNOSIS — Z23 Encounter for immunization: Secondary | ICD-10-CM | POA: Diagnosis not present

## 2020-08-28 DIAGNOSIS — S61411A Laceration without foreign body of right hand, initial encounter: Secondary | ICD-10-CM | POA: Diagnosis not present

## 2020-08-28 DIAGNOSIS — M549 Dorsalgia, unspecified: Secondary | ICD-10-CM | POA: Diagnosis not present

## 2020-08-28 DIAGNOSIS — Z794 Long term (current) use of insulin: Secondary | ICD-10-CM | POA: Insufficient documentation

## 2020-08-28 DIAGNOSIS — M79652 Pain in left thigh: Secondary | ICD-10-CM | POA: Diagnosis not present

## 2020-08-28 DIAGNOSIS — M25462 Effusion, left knee: Secondary | ICD-10-CM | POA: Diagnosis not present

## 2020-08-28 DIAGNOSIS — M25512 Pain in left shoulder: Secondary | ICD-10-CM | POA: Diagnosis not present

## 2020-08-28 DIAGNOSIS — I129 Hypertensive chronic kidney disease with stage 1 through stage 4 chronic kidney disease, or unspecified chronic kidney disease: Secondary | ICD-10-CM | POA: Diagnosis not present

## 2020-08-28 DIAGNOSIS — S6991XA Unspecified injury of right wrist, hand and finger(s), initial encounter: Secondary | ICD-10-CM | POA: Diagnosis present

## 2020-08-28 DIAGNOSIS — Z043 Encounter for examination and observation following other accident: Secondary | ICD-10-CM | POA: Diagnosis not present

## 2020-08-28 DIAGNOSIS — S80212A Abrasion, left knee, initial encounter: Secondary | ICD-10-CM | POA: Insufficient documentation

## 2020-08-28 DIAGNOSIS — R52 Pain, unspecified: Secondary | ICD-10-CM

## 2020-08-28 DIAGNOSIS — T07XXXA Unspecified multiple injuries, initial encounter: Secondary | ICD-10-CM

## 2020-08-28 DIAGNOSIS — Z96611 Presence of right artificial shoulder joint: Secondary | ICD-10-CM | POA: Insufficient documentation

## 2020-08-28 DIAGNOSIS — W19XXXA Unspecified fall, initial encounter: Secondary | ICD-10-CM | POA: Diagnosis not present

## 2020-08-28 DIAGNOSIS — N189 Chronic kidney disease, unspecified: Secondary | ICD-10-CM | POA: Insufficient documentation

## 2020-08-28 DIAGNOSIS — Y92511 Restaurant or cafe as the place of occurrence of the external cause: Secondary | ICD-10-CM | POA: Diagnosis not present

## 2020-08-28 DIAGNOSIS — Z041 Encounter for examination and observation following transport accident: Secondary | ICD-10-CM | POA: Diagnosis not present

## 2020-08-28 DIAGNOSIS — M47816 Spondylosis without myelopathy or radiculopathy, lumbar region: Secondary | ICD-10-CM | POA: Diagnosis not present

## 2020-08-28 DIAGNOSIS — S80211A Abrasion, right knee, initial encounter: Secondary | ICD-10-CM | POA: Diagnosis not present

## 2020-08-28 DIAGNOSIS — Z7982 Long term (current) use of aspirin: Secondary | ICD-10-CM | POA: Diagnosis not present

## 2020-08-28 DIAGNOSIS — M545 Low back pain, unspecified: Secondary | ICD-10-CM | POA: Diagnosis not present

## 2020-08-28 DIAGNOSIS — E119 Type 2 diabetes mellitus without complications: Secondary | ICD-10-CM | POA: Insufficient documentation

## 2020-08-28 DIAGNOSIS — W010XXA Fall on same level from slipping, tripping and stumbling without subsequent striking against object, initial encounter: Secondary | ICD-10-CM | POA: Diagnosis not present

## 2020-08-28 MED ORDER — OXYCODONE-ACETAMINOPHEN 5-325 MG PO TABS: 1.0000 | ORAL_TABLET | Freq: Four times a day (QID) | ORAL | 0 refills | Status: DC | PRN

## 2020-08-28 MED ORDER — TETANUS-DIPHTH-ACELL PERTUSSIS 5-2.5-18.5 LF-MCG/0.5 IM SUSY
0.5000 mL | PREFILLED_SYRINGE | Freq: Once | INTRAMUSCULAR | Status: AC
  Administered 2020-08-28: 0.5 mL via INTRAMUSCULAR
  Filled 2020-08-28: qty 0.5

## 2020-08-28 MED ORDER — OXYCODONE-ACETAMINOPHEN 5-325 MG PO TABS
1.0000 | ORAL_TABLET | Freq: Once | ORAL | Status: AC
  Administered 2020-08-28: 1 via ORAL
  Filled 2020-08-28: qty 1

## 2020-08-28 NOTE — ED Notes (Signed)
Abrasions on left knee and rt hand cleansed  Await dr wife at bedside

## 2020-08-28 NOTE — Discharge Instructions (Addendum)
You are seen in the emergency department for evaluation of injuries from a fall.  You had x-rays of your left hip femur and knee that did not show any obvious fracture or dislocation.  You had some abrasions.  We are prescribing some pain medication.  You can also try Aleve.  Contact your orthopedic for follow-up.

## 2020-08-28 NOTE — ED Provider Notes (Signed)
[facility] EMERGENCY DEPARTMENT Provider Note   CSN: [id number] Arrival date & time: 08/28/20  1726     History Chief Complaint  Patient presents with  . Oxford is a 70 y.o. male.  He is here for evaluation of injuries after a slip and fall while at a restaurant.  He said his left leg went underneath him.  No head injury or loss of consciousness.  Complaining of pain mostly in his left thigh worse with any type of movement.  He has minimally been able to ambulate with assistance.  No headache neck pain numbness or weakness.  No chest or abdominal pain.  He has not on any blood thinners.  The history is provided by the patient and the spouse.  Fall This is a new problem. The current episode started 3 to 5 hours ago. The problem has not changed since onset.Pertinent negatives include no chest pain, no abdominal pain, no headaches and no shortness of breath. The symptoms are aggravated by bending, twisting and walking. Nothing relieves the symptoms. He has tried nothing for the symptoms. The treatment provided no relief.       Past Medical History:  Diagnosis Date  . Allergy   . Arthritis    left shoulder and knees   . Chronic kidney disease    kidney stones  . Constipation    occasionally   . DM (diabetes mellitus) ([place])    takes Victoza daily;Humalog and Levemir daily  . GERD (gastroesophageal reflux disease)    but doesn't take any meds  . Headache(784.0)    occasionally to rare   . Hemorrhoids   . History of gout   . HTN (hypertension)    takes Ramipril daily  . Hypercholesteremia   . Hyperlipidemia    takes Vytorin daily  . IBS (irritable bowel syndrome)   . Insomnia    doesn't require meds  . Irritable bowel syndrome   . Joint pain   . Morbid obesity ([place])   . Obstructive sleep apnea (adult) (pediatric)    wears cpap   . Osteoarthritis of right shoulder region 02/13/2013  . Pain in limb   . Peripheral edema    takes Furosemide daily   . Pneumonia    hx of;about 65yrs ago  . Psoriasis   . Shortness of breath    with exertion  . Sleep apnea   . Snoring 12/07/2012    Patient Active Problem List   Diagnosis Date Noted  . Osteoarthritis of right shoulder region 02/13/2013  . Snoring 12/07/2012    Past Surgical History:  Procedure Laterality Date  . ANKLE SURGERY Right   . COLONOSCOPY  [id number]  . ESOPHAGOGASTRODUODENOSCOPY    . KNEE SURGERY Bilateral   . TOTAL SHOULDER ARTHROPLASTY Right 02/13/2013   Procedure: TOTAL SHOULDER ARTHROPLASTY;  Surgeon: Johnny Bridge, MD;  Location: [facility];  Service: Orthopedics;  Laterality: Right;  . TOTAL SHOULDER REPLACEMENT Right 02/13/2013   Dr Mardelle Matte       Family History  Problem Relation Age of Onset  . Diabetes Mother   . Colon cancer Neg Hx   . Colon polyps Neg Hx   . Esophageal cancer Neg Hx   . Rectal cancer Neg Hx   . Stomach cancer Neg Hx     Social History   Tobacco Use  . Smoking status: Never Smoker  . Smokeless tobacco: Never Used  Substance Use Topics  . Alcohol use: Yes  Alcohol/week: 0.0 standard drinks    Comment: occasionally beer  . Drug use: No    Home Medications Prior to Admission medications   Medication Sig Start Date End Date Taking? Authorizing Provider  aspirin 81 MG tablet Take 81 mg by mouth daily.    [provider]  Choline Fenofibrate (FENOFIBRIC ACID) 135 MG CPDR Take 1 tablet by mouth daily.  11/20/12   [provider]  furosemide (LASIX) 40 MG tablet Take 40 mg by mouth daily.  10/21/12   [provider]  gabapentin (NEURONTIN) 100 MG capsule TAKE ONE CAPSULE BY MOUTH 1 TIME A DAY AS NEEDED 03/26/16   [provider]  HUMALOG KWIKPEN 100 UNIT/ML KiwkPen INJECT 16 UNITS WITH BIGGEST MEAL 03/19/16   [provider]  INVOKANA 300 MG TABS tablet Take 300 mg by mouth daily. 02/24/14   [provider]  LEVEMIR FLEXTOUCH 100 UNIT/ML Pen INJECT 50 UNITS TWICE DAILY 02/14/16    [provider]  Melatonin 10 MG TABS Take 1 tablet by mouth daily.    [provider]  NOVOTWIST 32G X 5 MM MISC Pt uses 5 needles daily 10/24/12   [provider]  ONE TOUCH ULTRA TEST test strip  11/29/12   [provider]  POTASSIUM GLUCONATE PO Take 1 tablet by mouth daily as needed. Reported on 10/07/2015    [provider]  ramipril (ALTACE) 10 MG capsule Take 10 mg by mouth daily.  11/20/12   [provider]  Semaglutide,0.25 or 0.5MG /DOS, (OZEMPIC, 0.25 OR 0.5 MG/DOSE,) 2 MG/1.5ML SOPN Inject into the skin.    [provider]  VYTORIN 10-40 MG per tablet Take 1 tablet by mouth daily.  11/28/12   [provider]    Allergies    Patient has no known allergies.  Review of Systems   Review of Systems  Constitutional: Negative for fever.  HENT: Negative for sore throat.   Eyes: Negative for visual disturbance.  Respiratory: Negative for shortness of breath.   Cardiovascular: Negative for chest pain.  Gastrointestinal: Negative for abdominal pain.  Genitourinary: Negative for dysuria.  Musculoskeletal: Negative for neck pain.  Skin: Positive for wound. Negative for rash.  Neurological: Negative for headaches.    Physical Exam Updated Vital Signs BP 129/64 (BP Location: Right Arm)   Pulse 65   Temp 98.8 F (37.1 C) (Oral)   Resp 18   Ht 5\' 10"  (1.778 m) Comment: Simultaneous filing. User may not have seen previous data.  Wt 119.3 kg Comment: Simultaneous filing. User may not have seen previous data.  SpO2 96%   BMI 37.74 kg/m   Physical Exam Vitals and nursing note reviewed.  Constitutional:      Appearance: Normal appearance. He is well-developed.  HENT:     Head: Normocephalic and atraumatic.  Eyes:     Conjunctiva/sclera: Conjunctivae normal.  Cardiovascular:     Rate and Rhythm: Normal rate and regular rhythm.     Heart sounds: No murmur heard.   Pulmonary:     Effort: Pulmonary effort is  normal. No respiratory distress.     Breath sounds: Normal breath sounds.  Abdominal:     Palpations: Abdomen is soft.     Tenderness: There is no abdominal tenderness.  Musculoskeletal:        General: Tenderness present. No deformity. Normal range of motion.     Cervical back: Normal range of motion and neck supple.     Right lower leg: No edema.  Left lower leg: No edema.     Comments: He has some skin tears on the medial side of his right hand.  He has abrasions over his left knee.  He has diffuse tenderness of his left knee and left thigh.  Hip has normal internal and external rotation.  Right lower extremity full range of motion without any pain or limitations.  Upper extremities full range of motion without any limitations.  He does have some chronic left shoulder pain.  Skin:    General: Skin is warm and dry.     Capillary Refill: Capillary refill takes less than 2 seconds.  Neurological:     General: No focal deficit present.     Mental Status: He is alert.     ED Results / Procedures / Treatments   Labs (all labs ordered are listed, but only abnormal results are displayed) Labs Reviewed - No data to display  EKG None  Radiology DG Pelvis 1-2 Views  Result Date: 08/28/2020 CLINICAL DATA:  Status post fall. EXAM: PELVIS - 1-2 VIEW COMPARISON:  None. FINDINGS: There is no evidence of pelvic fracture or diastasis. Degenerative changes seen involving both hips in the form of joint space narrowing and acetabular sclerosis. Additional degenerative changes are seen within the visualized portion of the lower lumbar spine. No pelvic bone lesions are seen. IMPRESSION: Degenerative changes without an acute osseous abnormality. Electronically Signed   By: Virgina Norfolk M.D.   On: 08/28/2020 20:17   DG Knee Complete 4 Views Left  Result Date: 08/28/2020 CLINICAL DATA:  Status post fall. EXAM: LEFT KNEE - COMPLETE 4+ VIEW COMPARISON:  None. FINDINGS: No evidence of an acute  fracture or dislocation. Moderate severity medial tibiofemoral compartment space narrowing is seen. A small joint effusion is noted. IMPRESSION: Small joint effusion without an acute osseous abnormality. Electronically Signed   By: Virgina Norfolk M.D.   On: 08/28/2020 20:19   DG FEMUR MIN 2 VIEWS LEFT  Result Date: 08/28/2020 CLINICAL DATA:  Status post fall. EXAM: LEFT FEMUR 2 VIEWS COMPARISON:  None. FINDINGS: There is no evidence of fracture or other focal bone lesions. Soft tissues are unremarkable. IMPRESSION: Negative. Electronically Signed   By: Virgina Norfolk M.D.   On: 08/28/2020 20:17    Procedures Procedures   Medications Ordered in ED Medications  oxyCODONE-acetaminophen (PERCOCET/ROXICET) 5-325 MG per tablet 1 tablet (has no administration in time range)  Tdap (BOOSTRIX) injection 0.5 mL (has no administration in time range)    ED Course  I have reviewed the triage vital signs and the nursing notes.  Pertinent labs & imaging results that were available during my care of the patient were reviewed by me and considered in my medical decision making (see chart for details).  Clinical Course as of 08/29/20 5176  Thu Aug 28, 2020  2103 She states he was able to ambulate well enough to be discharged home.  We will send a prescription for pain medicine.  He follows with Dr. Mardelle Matte from orthopedics.  Recommended he contact their office for follow-up. [MB]    Clinical Course User Index [MB] Hayden Rasmussen, MD   MDM Rules/Calculators/A&P                         This patient complains of left thigh pain after a fall; this involves an extensive number of treatment Options and is a complaint that carries with it a high risk of complications and Morbidity. The  differential includes hip fracture, other fracture, dislocation, muscular strain, dislocation  I ordered medication tetanus update and oral pain medication, wife driving I ordered imaging studies which included x-rays  of pelvis left hip femur and knee and I independently    visualized and interpreted imaging which showed no acute fracture or dislocation, small knee effusion Additional history obtained from patient's wife Previous records obtained and reviewed in epic, no recent admissions  After the interventions stated above, I reevaluated the patient and found patient was able to slowly but safely ambulate in the department.  He and his wife feel he will be able to manage at home.  Return instructions discussed.   Final Clinical Impression(s) / ED Diagnoses Final diagnoses:  Acute pain of left thigh  Abrasions of multiple sites    Rx / DC Orders ED Discharge Orders         Ordered    oxyCODONE-acetaminophen (PERCOCET/ROXICET) 5-325 MG tablet  Every 6 hours PRN        08/28/20 2104           Hayden Rasmussen, MD 08/29/20 [phone]

## 2020-08-28 NOTE — ED Triage Notes (Addendum)
Slipped on wet mat at restaurant and fell twisting left thigh , has abrasions to both knees and rt  hand , did not hit head no loc  C/o left thigh pain with movement,  And lower back pain has all unknown last tetanus

## 2020-11-05 DIAGNOSIS — I129 Hypertensive chronic kidney disease with stage 1 through stage 4 chronic kidney disease, or unspecified chronic kidney disease: Secondary | ICD-10-CM | POA: Diagnosis not present

## 2020-11-05 DIAGNOSIS — N1831 Chronic kidney disease, stage 3a: Secondary | ICD-10-CM | POA: Diagnosis not present

## 2020-11-05 DIAGNOSIS — E1129 Type 2 diabetes mellitus with other diabetic kidney complication: Secondary | ICD-10-CM | POA: Diagnosis not present

## 2020-11-05 DIAGNOSIS — Z794 Long term (current) use of insulin: Secondary | ICD-10-CM | POA: Diagnosis not present

## 2020-11-05 DIAGNOSIS — E7800 Pure hypercholesterolemia, unspecified: Secondary | ICD-10-CM | POA: Diagnosis not present

## 2020-11-08 ENCOUNTER — Encounter: Payer: Self-pay | Admitting: Gastroenterology

## 2020-11-24 ENCOUNTER — Encounter: Payer: Self-pay | Admitting: Adult Health

## 2020-11-24 ENCOUNTER — Ambulatory Visit: Payer: Medicare PPO | Admitting: Adult Health

## 2020-11-24 VITALS — BP 149/78 | HR 64 | Ht 70.0 in | Wt 261.4 lb

## 2020-11-24 DIAGNOSIS — G4733 Obstructive sleep apnea (adult) (pediatric): Secondary | ICD-10-CM

## 2020-11-24 DIAGNOSIS — Z9989 Dependence on other enabling machines and devices: Secondary | ICD-10-CM

## 2020-11-24 NOTE — Progress Notes (Addendum)
PATIENT: Jamie Figueroa DOB: Nov 30, 1950  REASON FOR VISIT: follow up HISTORY FROM: patient Primary neurologist: Dr. Rexene Alberts  HISTORY OF PRESENT ILLNESS: Today 11/24/20:  Mr. Bolon is a 70 year old male with a history of obstructive sleep apnea on CPAP.  He returns today for follow-up.  He states the first week of August he was on vacation and did not use his machine.  He states the nights he does have restless nights.  He does not feel the mask leaking most nights.  His download is below    11/21/19: Mr. Franchetti  is a 70 year old male with a history of obstructive sleep apnea on CPAP.  He returns today for follow-up.  His download indicates that he use his machine 13 out of 30 days for compliance of 43%.  He uses machine greater than 4 hours each night.  On average he uses it 4 hours and 57 minutes.  His residual AHI is 3.1 on 11 cm of water with EPR of 3.  He reports that there are some nights that he forgets to put his machine on and falls asleep.  He also states that they went on vacation for a week and he did not take it with him.  HISTORY 05/24/19:   Mr. Albrecht is a 70 year old male with a history of obstructive sleep apnea on CPAP.  He returns today for follow-up.  His download indicates that he uses machine 20 out of 30 days for compliance of 67%.  He uses machine greater than 4 hours 17 days for compliance of 57%.  On average he uses his machine 5 hours and 2 minutes.  His residual AHI is 2.5 on 11 cm of water with EPR 3.  His leak in the 95th percentile is 34 L/min.  He reports that the last visit he never got a call from his DME company to have a mask refitting.  He states that most nights he is unable to use the machine continuously due to the leak.  He returns today for an evaluation.  REVIEW OF SYSTEMS: Out of a complete 14 system review of symptoms, the patient complains only of the following symptoms, and all other reviewed systems are negative.  ESS 3  ALLERGIES: No Known  Allergies  HOME MEDICATIONS: Outpatient Medications Prior to Visit  Medication Sig Dispense Refill   aspirin 81 MG tablet Take 81 mg by mouth daily.     Choline Fenofibrate (FENOFIBRIC ACID) 135 MG CPDR Take 1 tablet by mouth daily.      furosemide (LASIX) 40 MG tablet Take 40 mg by mouth daily.      gabapentin (NEURONTIN) 100 MG capsule TAKE ONE CAPSULE BY MOUTH 1 TIME A DAY AS NEEDED  0   HUMALOG KWIKPEN 100 UNIT/ML KiwkPen INJECT 16 UNITS WITH BIGGEST MEAL  3   INVOKANA 300 MG TABS tablet Take 300 mg by mouth daily.  6   LEVEMIR FLEXTOUCH 100 UNIT/ML Pen INJECT 50 UNITS TWICE DAILY  2   Melatonin 10 MG TABS Take 1 tablet by mouth daily.     NOVOTWIST 32G X 5 MM MISC Pt uses 5 needles daily     ONE TOUCH ULTRA TEST test strip      oxyCODONE-acetaminophen (PERCOCET/ROXICET) 5-325 MG tablet Take 1 tablet by mouth every 6 (six) hours as needed for severe pain. 10 tablet 0   POTASSIUM GLUCONATE PO Take 1 tablet by mouth daily as needed. Reported on 10/07/2015     ramipril (ALTACE) 10  MG capsule Take 10 mg by mouth daily.      Semaglutide,0.25 or 0.'5MG'$ /DOS, (OZEMPIC, 0.25 OR 0.5 MG/DOSE,) 2 MG/1.5ML SOPN Inject into the skin.     VYTORIN 10-40 MG per tablet Take 1 tablet by mouth daily.      No facility-administered medications prior to visit.    PAST MEDICAL HISTORY: Past Medical History:  Diagnosis Date   Allergy    Arthritis    left shoulder and knees    Chronic kidney disease    kidney stones   Constipation    occasionally    DM (diabetes mellitus) ([place])    takes Victoza daily;Humalog and Levemir daily   GERD (gastroesophageal reflux disease)    but doesn't take any meds   Headache(784.0)    occasionally to rare    Hemorrhoids    History of gout    HTN (hypertension)    takes Ramipril daily   Hypercholesteremia    Hyperlipidemia    takes Vytorin daily   IBS (irritable bowel syndrome)    Insomnia    doesn't require meds   Irritable bowel syndrome    Joint pain     Morbid obesity ([place])    Obstructive sleep apnea (adult) (pediatric)    wears cpap    Osteoarthritis of right shoulder region 02/13/2013   Pain in limb    Peripheral edema    takes Furosemide daily   Pneumonia    hx of;about 58yr ago   Psoriasis    Shortness of breath    with exertion   Sleep apnea    Snoring 12/07/2012    PAST SURGICAL HISTORY: Past Surgical History:  Procedure Laterality Date   ANKLE SURGERY Right    COLONOSCOPY  2007,2003   ESOPHAGOGASTRODUODENOSCOPY     KNEE SURGERY Bilateral    TOTAL SHOULDER ARTHROPLASTY Right 02/13/2013   Procedure: TOTAL SHOULDER ARTHROPLASTY;  Surgeon: JJohnny Bridge MD;  Location: [facility]  Service: Orthopedics;  Laterality: Right;   TOTAL SHOULDER REPLACEMENT Right 02/13/2013   Dr LMardelle Matte   FAMILY HISTORY: Family History  Problem Relation Age of Onset   Diabetes Mother    Colon cancer Neg Hx    Colon polyps Neg Hx    Esophageal cancer Neg Hx    Rectal cancer Neg Hx    Stomach cancer Neg Hx     SOCIAL HISTORY: Social History   Socioeconomic History   Marital status: Married    Spouse name: Not on file   Number of children: Not on file   Years of education: Not on file   Highest education level: Not on file  Occupational History   Not on file  Tobacco Use   Smoking status: Never   Smokeless tobacco: Never  Substance and Sexual Activity   Alcohol use: Yes    Alcohol/week: 0.0 standard drinks    Comment: occasionally beer   Drug use: No   Sexual activity: Yes  Other Topics Concern   Not on file  Social History Narrative   Not on file   Social Determinants of Health   Financial Resource Strain: Not on file  Food Insecurity: Not on file  Transportation Needs: Not on file  Physical Activity: Not on file  Stress: Not on file  Social Connections: Not on file  Intimate Partner Violence: Not on file      PHYSICAL EXAM  Vitals:   11/24/20 1044  BP: (!) 149/78  Pulse: 64  Weight: 261 lb 6.4 oz (118.6  kg)   Height: '5\' 10"'$  (1.778 m)    Body mass index is 37.51 kg/m.  Generalized: Well developed, in no acute distress  Chest: Lungs clear to auscultation bilaterally  Neurological examination  Mentation: Alert oriented to time, place, history taking. Follows all commands speech and language fluent Cranial nerve II-XII: Extraocular movements were full, visual field were full on confrontational test Head turning and shoulder shrug  were normal and symmetric. Motor: The motor testing reveals 5 over 5 strength of all 4 extremities. Good symmetric motor tone is noted throughout.  Sensory: Sensory testing is intact to soft touch on all 4 extremities. No evidence of extinction is noted.  Gait and station: Gait is normal.    DIAGNOSTIC DATA (LABS, IMAGING, TESTING) - I reviewed patient records, labs, notes, testing and imaging myself where available.  Lab Results  Component Value Date   WBC 13.1 (H) 02/14/2013   HGB 12.6 (L) 02/14/2013   HCT 37.5 (L) 02/14/2013   MCV 96.9 02/14/2013   PLT 187 02/14/2013      Component Value Date/Time   NA 136 02/14/2013 0534   K 4.6 02/14/2013 0534   CL 104 02/14/2013 0534   CO2 23 02/14/2013 0534   GLUCOSE 231 (H) 02/14/2013 0534   BUN 21 02/14/2013 0534   CREATININE 1.25 02/14/2013 0534   CALCIUM 8.4 02/14/2013 0534   GFRNONAA 60 (L) 02/14/2013 0534   GFRAA 69 (L) 02/14/2013 0534      ASSESSMENT AND PLAN 70 y.o. year old male  has a past medical history of Allergy, Arthritis, Chronic kidney disease, Constipation, DM (diabetes mellitus) ([place]), GERD (gastroesophageal reflux disease), Headache(784.0), Hemorrhoids, History of gout, HTN (hypertension), Hypercholesteremia, Hyperlipidemia, IBS (irritable bowel syndrome), Insomnia, Irritable bowel syndrome, Joint pain, Morbid obesity ([place]), Obstructive sleep apnea (adult) (pediatric), Osteoarthritis of right shoulder region (02/13/2013), Pain in limb, Peripheral edema, Pneumonia, Psoriasis, Shortness of  breath, Sleep apnea, and Snoring (12/07/2012). here with:  OSA on CPAP  - CPAP compliance is suboptimal - Good treatment of AHI when he uses his machine - Encourage patient to use CPAP nightly and > 4 hours each night - F/U in 1 year or sooner if needed   Ward Givens, MSN, NP-C 11/24/2020, 10:40 AM [facility] [address] [place], [place] [address] [phone]  I reviewed the above note and documentation by the Nurse Practitioner and agree with the history, exam, assessment and plan as outlined above. I was available for consultation. Star Age, MD, PhD [facility])

## 2020-11-24 NOTE — Patient Instructions (Signed)
Continue using CPAP nightly and greater than 4 hours each night °If your symptoms worsen or you develop new symptoms please let us know.  ° °

## 2020-11-28 DIAGNOSIS — G4733 Obstructive sleep apnea (adult) (pediatric): Secondary | ICD-10-CM | POA: Diagnosis not present

## 2020-11-28 DIAGNOSIS — E1065 Type 1 diabetes mellitus with hyperglycemia: Secondary | ICD-10-CM | POA: Diagnosis not present

## 2020-12-10 DIAGNOSIS — N1831 Chronic kidney disease, stage 3a: Secondary | ICD-10-CM | POA: Diagnosis not present

## 2020-12-10 DIAGNOSIS — Z794 Long term (current) use of insulin: Secondary | ICD-10-CM | POA: Diagnosis not present

## 2020-12-10 DIAGNOSIS — E7800 Pure hypercholesterolemia, unspecified: Secondary | ICD-10-CM | POA: Diagnosis not present

## 2020-12-10 DIAGNOSIS — E1129 Type 2 diabetes mellitus with other diabetic kidney complication: Secondary | ICD-10-CM | POA: Diagnosis not present

## 2020-12-10 DIAGNOSIS — I129 Hypertensive chronic kidney disease with stage 1 through stage 4 chronic kidney disease, or unspecified chronic kidney disease: Secondary | ICD-10-CM | POA: Diagnosis not present

## 2020-12-29 DIAGNOSIS — E1065 Type 1 diabetes mellitus with hyperglycemia: Secondary | ICD-10-CM | POA: Diagnosis not present

## 2021-01-28 DIAGNOSIS — N1831 Chronic kidney disease, stage 3a: Secondary | ICD-10-CM | POA: Diagnosis not present

## 2021-01-28 DIAGNOSIS — E1129 Type 2 diabetes mellitus with other diabetic kidney complication: Secondary | ICD-10-CM | POA: Diagnosis not present

## 2021-01-28 DIAGNOSIS — I129 Hypertensive chronic kidney disease with stage 1 through stage 4 chronic kidney disease, or unspecified chronic kidney disease: Secondary | ICD-10-CM | POA: Diagnosis not present

## 2021-01-28 DIAGNOSIS — Z6837 Body mass index (BMI) 37.0-37.9, adult: Secondary | ICD-10-CM | POA: Diagnosis not present

## 2021-01-28 DIAGNOSIS — Z23 Encounter for immunization: Secondary | ICD-10-CM | POA: Diagnosis not present

## 2021-01-28 DIAGNOSIS — E7800 Pure hypercholesterolemia, unspecified: Secondary | ICD-10-CM | POA: Diagnosis not present

## 2021-01-28 DIAGNOSIS — E1065 Type 1 diabetes mellitus with hyperglycemia: Secondary | ICD-10-CM | POA: Diagnosis not present

## 2021-01-28 DIAGNOSIS — Z794 Long term (current) use of insulin: Secondary | ICD-10-CM | POA: Diagnosis not present

## 2021-02-02 DIAGNOSIS — H2513 Age-related nuclear cataract, bilateral: Secondary | ICD-10-CM | POA: Diagnosis not present

## 2021-02-02 DIAGNOSIS — E119 Type 2 diabetes mellitus without complications: Secondary | ICD-10-CM | POA: Diagnosis not present

## 2021-02-02 DIAGNOSIS — H524 Presbyopia: Secondary | ICD-10-CM | POA: Diagnosis not present

## 2021-02-02 DIAGNOSIS — H5211 Myopia, right eye: Secondary | ICD-10-CM | POA: Diagnosis not present

## 2021-02-18 DIAGNOSIS — D1801 Hemangioma of skin and subcutaneous tissue: Secondary | ICD-10-CM | POA: Diagnosis not present

## 2021-02-18 DIAGNOSIS — Z85828 Personal history of other malignant neoplasm of skin: Secondary | ICD-10-CM | POA: Diagnosis not present

## 2021-02-18 DIAGNOSIS — D2362 Other benign neoplasm of skin of left upper limb, including shoulder: Secondary | ICD-10-CM | POA: Diagnosis not present

## 2021-02-18 DIAGNOSIS — L570 Actinic keratosis: Secondary | ICD-10-CM | POA: Diagnosis not present

## 2021-02-18 DIAGNOSIS — L821 Other seborrheic keratosis: Secondary | ICD-10-CM | POA: Diagnosis not present

## 2021-02-18 DIAGNOSIS — L814 Other melanin hyperpigmentation: Secondary | ICD-10-CM | POA: Diagnosis not present

## 2021-02-20 DIAGNOSIS — E7800 Pure hypercholesterolemia, unspecified: Secondary | ICD-10-CM | POA: Diagnosis not present

## 2021-02-20 DIAGNOSIS — D692 Other nonthrombocytopenic purpura: Secondary | ICD-10-CM | POA: Diagnosis not present

## 2021-02-20 DIAGNOSIS — E1165 Type 2 diabetes mellitus with hyperglycemia: Secondary | ICD-10-CM | POA: Diagnosis not present

## 2021-02-20 DIAGNOSIS — M5489 Other dorsalgia: Secondary | ICD-10-CM | POA: Diagnosis not present

## 2021-02-20 DIAGNOSIS — I129 Hypertensive chronic kidney disease with stage 1 through stage 4 chronic kidney disease, or unspecified chronic kidney disease: Secondary | ICD-10-CM | POA: Diagnosis not present

## 2021-02-20 DIAGNOSIS — R413 Other amnesia: Secondary | ICD-10-CM | POA: Diagnosis not present

## 2021-02-20 DIAGNOSIS — E1129 Type 2 diabetes mellitus with other diabetic kidney complication: Secondary | ICD-10-CM | POA: Diagnosis not present

## 2021-02-20 DIAGNOSIS — Z794 Long term (current) use of insulin: Secondary | ICD-10-CM | POA: Diagnosis not present

## 2021-02-28 DIAGNOSIS — E1065 Type 1 diabetes mellitus with hyperglycemia: Secondary | ICD-10-CM | POA: Diagnosis not present

## 2021-03-02 ENCOUNTER — Other Ambulatory Visit (HOSPITAL_COMMUNITY): Payer: Self-pay

## 2021-03-02 MED ORDER — OZEMPIC (1 MG/DOSE) 4 MG/3ML ~~LOC~~ SOPN
1.0000 mg | PEN_INJECTOR | SUBCUTANEOUS | 4 refills | Status: DC
  Filled 2021-03-02: qty 3, 28d supply, fill #0
  Filled 2021-03-28: qty 3, 28d supply, fill #1
  Filled 2021-04-27: qty 3, 28d supply, fill #2
  Filled 2021-04-29: qty 3, 28d supply, fill #0
  Filled 2021-05-22: qty 3, 28d supply, fill #1
  Filled 2021-07-02: qty 3, 28d supply, fill #2

## 2021-03-28 ENCOUNTER — Other Ambulatory Visit (HOSPITAL_COMMUNITY): Payer: Self-pay

## 2021-03-30 DIAGNOSIS — E1065 Type 1 diabetes mellitus with hyperglycemia: Secondary | ICD-10-CM | POA: Diagnosis not present

## 2021-04-21 DIAGNOSIS — N1831 Chronic kidney disease, stage 3a: Secondary | ICD-10-CM | POA: Diagnosis not present

## 2021-04-21 DIAGNOSIS — Z794 Long term (current) use of insulin: Secondary | ICD-10-CM | POA: Diagnosis not present

## 2021-04-21 DIAGNOSIS — E1129 Type 2 diabetes mellitus with other diabetic kidney complication: Secondary | ICD-10-CM | POA: Diagnosis not present

## 2021-04-21 DIAGNOSIS — E7800 Pure hypercholesterolemia, unspecified: Secondary | ICD-10-CM | POA: Diagnosis not present

## 2021-04-21 DIAGNOSIS — I129 Hypertensive chronic kidney disease with stage 1 through stage 4 chronic kidney disease, or unspecified chronic kidney disease: Secondary | ICD-10-CM | POA: Diagnosis not present

## 2021-04-27 ENCOUNTER — Other Ambulatory Visit (HOSPITAL_COMMUNITY): Payer: Self-pay

## 2021-04-29 ENCOUNTER — Other Ambulatory Visit (HOSPITAL_COMMUNITY): Payer: Self-pay

## 2021-04-30 DIAGNOSIS — E1065 Type 1 diabetes mellitus with hyperglycemia: Secondary | ICD-10-CM | POA: Diagnosis not present

## 2021-05-22 ENCOUNTER — Other Ambulatory Visit (HOSPITAL_COMMUNITY): Payer: Self-pay

## 2021-05-31 DIAGNOSIS — E1065 Type 1 diabetes mellitus with hyperglycemia: Secondary | ICD-10-CM | POA: Diagnosis not present

## 2021-06-10 DIAGNOSIS — M170 Bilateral primary osteoarthritis of knee: Secondary | ICD-10-CM | POA: Diagnosis not present

## 2021-06-10 DIAGNOSIS — M93271 Osteochondritis dissecans, right ankle and joints of right foot: Secondary | ICD-10-CM | POA: Diagnosis not present

## 2021-06-28 DIAGNOSIS — E1065 Type 1 diabetes mellitus with hyperglycemia: Secondary | ICD-10-CM | POA: Diagnosis not present

## 2021-07-01 DIAGNOSIS — E1065 Type 1 diabetes mellitus with hyperglycemia: Secondary | ICD-10-CM | POA: Diagnosis not present

## 2021-07-02 ENCOUNTER — Other Ambulatory Visit (HOSPITAL_COMMUNITY): Payer: Self-pay

## 2021-07-21 DIAGNOSIS — N1831 Chronic kidney disease, stage 3a: Secondary | ICD-10-CM | POA: Diagnosis not present

## 2021-07-21 DIAGNOSIS — E1129 Type 2 diabetes mellitus with other diabetic kidney complication: Secondary | ICD-10-CM | POA: Diagnosis not present

## 2021-07-21 DIAGNOSIS — I129 Hypertensive chronic kidney disease with stage 1 through stage 4 chronic kidney disease, or unspecified chronic kidney disease: Secondary | ICD-10-CM | POA: Diagnosis not present

## 2021-07-21 DIAGNOSIS — E7800 Pure hypercholesterolemia, unspecified: Secondary | ICD-10-CM | POA: Diagnosis not present

## 2021-07-21 DIAGNOSIS — Z794 Long term (current) use of insulin: Secondary | ICD-10-CM | POA: Diagnosis not present

## 2021-07-29 DIAGNOSIS — E1065 Type 1 diabetes mellitus with hyperglycemia: Secondary | ICD-10-CM | POA: Diagnosis not present

## 2021-07-30 ENCOUNTER — Other Ambulatory Visit (HOSPITAL_COMMUNITY): Payer: Self-pay

## 2021-07-30 MED ORDER — OZEMPIC (1 MG/DOSE) 4 MG/3ML ~~LOC~~ SOPN
PEN_INJECTOR | SUBCUTANEOUS | 2 refills | Status: DC
  Filled 2021-07-30: qty 9, 84d supply, fill #0
  Filled 2021-10-30: qty 9, 84d supply, fill #1
  Filled 2022-01-18: qty 3, 28d supply, fill #2
  Filled 2022-02-22: qty 3, 28d supply, fill #3
  Filled 2022-03-20: qty 3, 28d supply, fill #4
  Filled 2022-04-23: qty 3, 28d supply, fill #5
  Filled 2022-05-22: qty 6, 56d supply, fill #6

## 2021-08-01 DIAGNOSIS — E1065 Type 1 diabetes mellitus with hyperglycemia: Secondary | ICD-10-CM | POA: Diagnosis not present

## 2021-08-28 DIAGNOSIS — E1065 Type 1 diabetes mellitus with hyperglycemia: Secondary | ICD-10-CM | POA: Diagnosis not present

## 2021-09-02 DIAGNOSIS — E1129 Type 2 diabetes mellitus with other diabetic kidney complication: Secondary | ICD-10-CM | POA: Diagnosis not present

## 2021-09-02 DIAGNOSIS — Z125 Encounter for screening for malignant neoplasm of prostate: Secondary | ICD-10-CM | POA: Diagnosis not present

## 2021-09-02 DIAGNOSIS — R7989 Other specified abnormal findings of blood chemistry: Secondary | ICD-10-CM | POA: Diagnosis not present

## 2021-09-02 DIAGNOSIS — Z0000 Encounter for general adult medical examination without abnormal findings: Secondary | ICD-10-CM | POA: Diagnosis not present

## 2021-09-02 DIAGNOSIS — E7800 Pure hypercholesterolemia, unspecified: Secondary | ICD-10-CM | POA: Diagnosis not present

## 2021-09-09 DIAGNOSIS — E1165 Type 2 diabetes mellitus with hyperglycemia: Secondary | ICD-10-CM | POA: Diagnosis not present

## 2021-09-09 DIAGNOSIS — E1129 Type 2 diabetes mellitus with other diabetic kidney complication: Secondary | ICD-10-CM | POA: Diagnosis not present

## 2021-09-09 DIAGNOSIS — Z0000 Encounter for general adult medical examination without abnormal findings: Secondary | ICD-10-CM | POA: Diagnosis not present

## 2021-09-09 DIAGNOSIS — Z1339 Encounter for screening examination for other mental health and behavioral disorders: Secondary | ICD-10-CM | POA: Diagnosis not present

## 2021-09-09 DIAGNOSIS — Z794 Long term (current) use of insulin: Secondary | ICD-10-CM | POA: Diagnosis not present

## 2021-09-09 DIAGNOSIS — E7800 Pure hypercholesterolemia, unspecified: Secondary | ICD-10-CM | POA: Diagnosis not present

## 2021-09-09 DIAGNOSIS — N1831 Chronic kidney disease, stage 3a: Secondary | ICD-10-CM | POA: Diagnosis not present

## 2021-09-09 DIAGNOSIS — D692 Other nonthrombocytopenic purpura: Secondary | ICD-10-CM | POA: Diagnosis not present

## 2021-09-09 DIAGNOSIS — Z1331 Encounter for screening for depression: Secondary | ICD-10-CM | POA: Diagnosis not present

## 2021-09-09 DIAGNOSIS — I517 Cardiomegaly: Secondary | ICD-10-CM | POA: Diagnosis not present

## 2021-09-09 DIAGNOSIS — I129 Hypertensive chronic kidney disease with stage 1 through stage 4 chronic kidney disease, or unspecified chronic kidney disease: Secondary | ICD-10-CM | POA: Diagnosis not present

## 2021-09-09 DIAGNOSIS — R82998 Other abnormal findings in urine: Secondary | ICD-10-CM | POA: Diagnosis not present

## 2021-09-28 DIAGNOSIS — E1065 Type 1 diabetes mellitus with hyperglycemia: Secondary | ICD-10-CM | POA: Diagnosis not present

## 2021-10-28 DIAGNOSIS — E1065 Type 1 diabetes mellitus with hyperglycemia: Secondary | ICD-10-CM | POA: Diagnosis not present

## 2021-10-30 ENCOUNTER — Other Ambulatory Visit (HOSPITAL_COMMUNITY): Payer: Self-pay

## 2021-10-31 ENCOUNTER — Other Ambulatory Visit (HOSPITAL_COMMUNITY): Payer: Self-pay

## 2021-11-18 DIAGNOSIS — E1129 Type 2 diabetes mellitus with other diabetic kidney complication: Secondary | ICD-10-CM | POA: Diagnosis not present

## 2021-11-18 DIAGNOSIS — I129 Hypertensive chronic kidney disease with stage 1 through stage 4 chronic kidney disease, or unspecified chronic kidney disease: Secondary | ICD-10-CM | POA: Diagnosis not present

## 2021-11-18 DIAGNOSIS — N1831 Chronic kidney disease, stage 3a: Secondary | ICD-10-CM | POA: Diagnosis not present

## 2021-11-18 DIAGNOSIS — Z794 Long term (current) use of insulin: Secondary | ICD-10-CM | POA: Diagnosis not present

## 2021-11-18 DIAGNOSIS — E7800 Pure hypercholesterolemia, unspecified: Secondary | ICD-10-CM | POA: Diagnosis not present

## 2021-11-23 NOTE — Progress Notes (Unsigned)
PATIENT: Jamie Figueroa DOB: 12/19/1950  REASON FOR VISIT: follow up HISTORY FROM: patient Primary neurologist: Dr. Frances Furbish  Chief Complaint  Patient presents with   Follow-up    Rm 18, wife.  CPAP f/u.      HISTORY OF PRESENT ILLNESS: Today 11/24/21:  Mr. Jamie Figueroa is a 71 year old male with a history of obstructive sleep apnea on CPAP.  He returns today for follow-up.  His download is below.  He states for the last 30 days he has not used it much because he was on vacation.  He states that he did take his machine with him but never set it up.  He also states that there are some nights he tends to fall asleep before putting it on.  He still struggles to use the CPAP.  Reports that he wakes up often at night.  Wife reports that he sleeps during the day and this is the cause of him not sleeping well at night.  Patient disagrees.    11/24/20: Mr. Jamie Figueroa is a 71 year old male with a history of obstructive sleep apnea on CPAP.  He returns today for follow-up.  He states the first week of August he was on vacation and did not use his machine.  He states the nights he does have restless nights.  He does not feel the mask leaking most nights.  His download is below    11/21/19: Mr. Jamie Figueroa  is a 71 year old male with a history of obstructive sleep apnea on CPAP.  He returns today for follow-up.  His download indicates that he use his machine 13 out of 30 days for compliance of 43%.  He uses machine greater than 4 hours each night.  On average he uses it 4 hours and 57 minutes.  His residual AHI is 3.1 on 11 cm of water with EPR of 3.  He reports that there are some nights that he forgets to put his machine on and falls asleep.  He also states that they went on vacation for a week and he did not take it with him.  HISTORY 05/24/19:   Mr. Jamie Figueroa is a 71 year old male with a history of obstructive sleep apnea on CPAP.  He returns today for follow-up.  His download indicates that he uses machine 20 out of  30 days for compliance of 67%.  He uses machine greater than 4 hours 17 days for compliance of 57%.  On average he uses his machine 5 hours and 2 minutes.  His residual AHI is 2.5 on 11 cm of water with EPR 3.  His leak in the 95th percentile is 34 L/min.  He reports that the last visit he never got a call from his DME company to have a mask refitting.  He states that most nights he is unable to use the machine continuously due to the leak.  He returns today for an evaluation.  REVIEW OF SYSTEMS: Out of a complete 14 system review of symptoms, the patient complains only of the following symptoms, and all other reviewed systems are negative.  ESS 12   ALLERGIES: Allergies  Allergen Reactions   Sulfamethoxazole-Trimethoprim Nausea Only    HOME MEDICATIONS: Outpatient Medications Prior to Visit  Medication Sig Dispense Refill   aspirin 81 MG tablet Take 81 mg by mouth daily.     Choline Fenofibrate (FENOFIBRIC ACID) 135 MG CPDR Take 1 tablet by mouth daily.      Continuous Blood Gluc Sensor (FREESTYLE LIBRE 2 SENSOR) MISC  change every 14 days . to monitor blood glucose     fluticasone (FLONASE) 50 MCG/ACT nasal spray 1 spray daily as needed.     furosemide (LASIX) 40 MG tablet Take 40 mg by mouth daily.      gabapentin (NEURONTIN) 100 MG capsule TAKE ONE CAPSULE BY MOUTH 1 TIME A DAY AS NEEDED  0   HUMALOG KWIKPEN 100 UNIT/ML KiwkPen INJECT 16 UNITS WITH BIGGEST MEAL  3   INVOKANA 300 MG TABS tablet Take 300 mg by mouth daily.  6   Melatonin 10 MG TABS Take 1 tablet by mouth daily.     NOVOTWIST 32G X 5 MM MISC Pt uses 5 needles daily     omeprazole (PRILOSEC) 20 MG capsule Take 20 mg by mouth daily.     ONE TOUCH ULTRA TEST test strip      oxyCODONE-acetaminophen (PERCOCET/ROXICET) 5-325 MG tablet Take 1 tablet by mouth every 6 (six) hours as needed for severe pain. 10 tablet 0   ramipril (ALTACE) 10 MG capsule Take 10 mg by mouth daily.      Semaglutide, 1 MG/DOSE, (OZEMPIC, 1 MG/DOSE,)  4 MG/3ML SOPN Inject 1 mg into the skin once a week 12 mL 2   Semaglutide,0.25 or 0.5MG /DOS, (OZEMPIC, 0.25 OR 0.5 MG/DOSE,) 2 MG/1.5ML SOPN Inject 1 mg into the skin once a week.     TRESIBA FLEXTOUCH 200 UNIT/ML FlexTouch Pen SMARTSIG:60 Unit(s) SUB-Q Daily     VYTORIN 10-40 MG per tablet Take 1 tablet by mouth daily.      LEVEMIR FLEXTOUCH 100 UNIT/ML Pen 48 Units in the morning and at bedtime. (Patient not taking: Reported on 11/24/2021)  2   POTASSIUM GLUCONATE PO Take 1 tablet by mouth daily as needed. Reported on 10/07/2015 (Patient not taking: Reported on 11/24/2021)     No facility-administered medications prior to visit.    PAST MEDICAL HISTORY: Past Medical History:  Diagnosis Date   Allergy    Arthritis    left shoulder and knees    Chronic kidney disease    kidney stones   Constipation    occasionally    DM (diabetes mellitus) (HCC)    takes Victoza daily;Humalog and Levemir daily   GERD (gastroesophageal reflux disease)    but doesn't take any meds   Headache(784.0)    occasionally to rare    Hemorrhoids    History of gout    HTN (hypertension)    takes Ramipril daily   Hypercholesteremia    Hyperlipidemia    takes Vytorin daily   IBS (irritable bowel syndrome)    Insomnia    doesn't require meds   Irritable bowel syndrome    Joint pain    Morbid obesity (HCC)    Obstructive sleep apnea (adult) (pediatric)    wears cpap    Osteoarthritis of right shoulder region 02/13/2013   Pain in limb    Peripheral edema    takes Furosemide daily   Pneumonia    hx of;about 42yrs ago   Psoriasis    Shortness of breath    with exertion   Sleep apnea    Snoring 12/07/2012    PAST SURGICAL HISTORY: Past Surgical History:  Procedure Laterality Date   ANKLE SURGERY Right    COLONOSCOPY  2007,2003   ESOPHAGOGASTRODUODENOSCOPY     KNEE SURGERY Bilateral    TOTAL SHOULDER ARTHROPLASTY Right 02/13/2013   Procedure: TOTAL SHOULDER ARTHROPLASTY;  Surgeon: Eulas Post,  MD;  Location: [facility] OR;  Service: Orthopedics;  Laterality: Right;   TOTAL SHOULDER REPLACEMENT Right 02/13/2013   Dr Dion Saucier    FAMILY HISTORY: Family History  Problem Relation Age of Onset   Diabetes Mother    Colon cancer Neg Hx    Colon polyps Neg Hx    Esophageal cancer Neg Hx    Rectal cancer Neg Hx    Stomach cancer Neg Hx     SOCIAL HISTORY: Social History   Socioeconomic History   Marital status: Married    Spouse name: Not on file   Number of children: Not on file   Years of education: Not on file   Highest education level: Not on file  Occupational History   Not on file  Tobacco Use   Smoking status: Never   Smokeless tobacco: Never  Substance and Sexual Activity   Alcohol use: Yes    Alcohol/week: 0.0 standard drinks of alcohol    Comment: occasionally beer   Drug use: No   Sexual activity: Yes  Other Topics Concern   Not on file  Social History Narrative   Not on file   Social Determinants of Health   Financial Resource Strain: Not on file  Food Insecurity: Not on file  Transportation Needs: Not on file  Physical Activity: Not on file  Stress: Not on file  Social Connections: Not on file  Intimate Partner Violence: Not on file      PHYSICAL EXAM  Vitals:   11/24/21 1026  BP: (!) 141/57  Pulse: 60  Weight: 263 lb 12.8 oz (119.7 kg)  Height: 5\' 10"  (1.778 m)    Body mass index is 37.85 kg/m.  Generalized: Well developed, in no acute distress  Chest: Lungs clear to auscultation bilaterally  Neurological examination  Mentation: Alert oriented to time, place, history taking. Follows all commands speech and language fluent Cranial nerve II-XII: Extraocular movements were full, visual field were full on confrontational test Head turning and shoulder shrug  were normal and symmetric.  Neck circumference 19 inches, Mallampati 3+ Gait and station: Gait is normal.    DIAGNOSTIC DATA (LABS, IMAGING, TESTING) - I reviewed patient records,  labs, notes, testing and imaging myself where available.  Lab Results  Component Value Date   WBC 13.1 (H) 02/14/2013   HGB 12.6 (L) 02/14/2013   HCT 37.5 (L) 02/14/2013   MCV 96.9 02/14/2013   PLT 187 02/14/2013      Component Value Date/Time   NA 136 02/14/2013 0534   K 4.6 02/14/2013 0534   CL 104 02/14/2013 0534   CO2 23 02/14/2013 0534   GLUCOSE 231 (H) 02/14/2013 0534   BUN 21 02/14/2013 0534   CREATININE 1.25 02/14/2013 0534   CALCIUM 8.4 02/14/2013 0534   GFRNONAA 60 (L) 02/14/2013 0534   GFRAA 69 (L) 02/14/2013 0534      ASSESSMENT AND PLAN 71 y.o. year old male  has a past medical history of Allergy, Arthritis, Chronic kidney disease, Constipation, DM (diabetes mellitus) (HCC), GERD (gastroesophageal reflux disease), Headache(784.0), Hemorrhoids, History of gout, HTN (hypertension), Hypercholesteremia, Hyperlipidemia, IBS (irritable bowel syndrome), Insomnia, Irritable bowel syndrome, Joint pain, Morbid obesity (HCC), Obstructive sleep apnea (adult) (pediatric), Osteoarthritis of right shoulder region (02/13/2013), Pain in limb, Peripheral edema, Pneumonia, Psoriasis, Shortness of breath, Sleep apnea, and Snoring (12/07/2012). here with:  OSA on CPAP  - CPAP compliance is suboptimal -Residual AHI has slightly increased.  He does have a leak.  However he is already done a mask refitting.  We will  adjust his pressure to AutoSet 7 to 15 cm of water to see if he finds this more comfortable. -Patient will call in 30 to 45 days for me to look at a download. - Encourage patient to use CPAP nightly and > 4 hours each night - F/U in 6 months or sooner if needed   Butch Penny, MSN, NP-C 11/24/2021, 10:47 AM [facility] [address], [address] [place], [place] [address] [phone]

## 2021-11-24 ENCOUNTER — Encounter: Payer: Self-pay | Admitting: Adult Health

## 2021-11-24 ENCOUNTER — Ambulatory Visit: Payer: Medicare PPO | Admitting: Adult Health

## 2021-11-24 VITALS — BP 141/57 | HR 60 | Ht 70.0 in | Wt 263.8 lb

## 2021-11-24 DIAGNOSIS — Z9989 Dependence on other enabling machines and devices: Secondary | ICD-10-CM

## 2021-11-24 DIAGNOSIS — G4733 Obstructive sleep apnea (adult) (pediatric): Secondary | ICD-10-CM | POA: Diagnosis not present

## 2021-11-24 NOTE — Patient Instructions (Signed)
Your Plan:  Change pressure auto 7-15 cmh20 Call in about 30-45 days and we will look at another download If your symptoms worsen or you develop new symptoms please let us know.   Thank you for coming to see [place] at [facility]. I hope we have been able to provide you high quality care today.  You may receive a patient satisfaction survey over the next few weeks. We would appreciate your feedback and comments so that we may continue to improve ourselves and the health of our patients.

## 2021-11-27 ENCOUNTER — Telehealth: Payer: Self-pay | Admitting: Adult Health

## 2021-11-27 NOTE — Telephone Encounter (Signed)
Wife(not on DPR) has called asking that Megan,NP be made aware that pt has not been updated re: airflow being increased or changed. Wife asking pt be called.

## 2021-11-28 DIAGNOSIS — E1065 Type 1 diabetes mellitus with hyperglycemia: Secondary | ICD-10-CM | POA: Diagnosis not present

## 2021-11-30 NOTE — Telephone Encounter (Signed)
I called and spoke to wife.  She had received a call from [employer] about supplies.  She did mention about change in pressure cpap to autoset.  I relayed I had sent community message last week and again this morning.  I did change the pressures Cpap to auto and  set pressures to 7-15cm/H20.  He may check and see if that comes up on his machine.  I do have the initial order until I get response that they received order.   She appreciated callback.

## 2021-11-30 NOTE — Telephone Encounter (Addendum)
Changed in airview to autopap and pressure changes.  I did send community message to [employer] relating to change.

## 2021-12-01 NOTE — Telephone Encounter (Signed)
Error

## 2021-12-01 NOTE — Telephone Encounter (Signed)
[name], [name], RN got it.      Previous Messages    ----- Message -----  From: Brandon Melnick, RN  Sent: 11/25/2021   8:10 AM EDT  To: Cammie Mcgee; Orrin Brigham [name]  Subject: pressure:  change to autoset 7-15               Good morning,   New order in Epic for change cpap to autoset 7-15 cmh20.     Jamie Figueroa  Male, 71 y.o., 1950/05/14  MRN:  [id number]  Thank you   Lovey Newcomer RN

## 2021-12-29 DIAGNOSIS — E1065 Type 1 diabetes mellitus with hyperglycemia: Secondary | ICD-10-CM | POA: Diagnosis not present

## 2022-01-18 ENCOUNTER — Other Ambulatory Visit (HOSPITAL_COMMUNITY): Payer: Self-pay

## 2022-01-28 DIAGNOSIS — E1065 Type 1 diabetes mellitus with hyperglycemia: Secondary | ICD-10-CM | POA: Diagnosis not present

## 2022-02-03 DIAGNOSIS — K5900 Constipation, unspecified: Secondary | ICD-10-CM | POA: Diagnosis not present

## 2022-02-03 DIAGNOSIS — I129 Hypertensive chronic kidney disease with stage 1 through stage 4 chronic kidney disease, or unspecified chronic kidney disease: Secondary | ICD-10-CM | POA: Diagnosis not present

## 2022-02-03 DIAGNOSIS — R109 Unspecified abdominal pain: Secondary | ICD-10-CM | POA: Diagnosis not present

## 2022-02-03 DIAGNOSIS — N1831 Chronic kidney disease, stage 3a: Secondary | ICD-10-CM | POA: Diagnosis not present

## 2022-02-03 DIAGNOSIS — K5792 Diverticulitis of intestine, part unspecified, without perforation or abscess without bleeding: Secondary | ICD-10-CM | POA: Diagnosis not present

## 2022-02-03 DIAGNOSIS — R1010 Upper abdominal pain, unspecified: Secondary | ICD-10-CM | POA: Diagnosis not present

## 2022-02-03 DIAGNOSIS — R110 Nausea: Secondary | ICD-10-CM | POA: Diagnosis not present

## 2022-02-03 DIAGNOSIS — E1129 Type 2 diabetes mellitus with other diabetic kidney complication: Secondary | ICD-10-CM | POA: Diagnosis not present

## 2022-02-03 DIAGNOSIS — Z794 Long term (current) use of insulin: Secondary | ICD-10-CM | POA: Diagnosis not present

## 2022-02-09 ENCOUNTER — Other Ambulatory Visit: Payer: Self-pay | Admitting: Internal Medicine

## 2022-02-09 DIAGNOSIS — R1010 Upper abdominal pain, unspecified: Secondary | ICD-10-CM

## 2022-02-09 DIAGNOSIS — R195 Other fecal abnormalities: Secondary | ICD-10-CM

## 2022-02-10 ENCOUNTER — Ambulatory Visit
Admission: RE | Admit: 2022-02-10 | Discharge: 2022-02-10 | Disposition: A | Discharge status: Home / Self Care | Payer: Medicare PPO | Source: Ambulatory Visit | Attending: Internal Medicine | Admitting: Internal Medicine

## 2022-02-10 DIAGNOSIS — R1010 Upper abdominal pain, unspecified: Secondary | ICD-10-CM

## 2022-02-10 DIAGNOSIS — I700 Atherosclerosis of aorta: Secondary | ICD-10-CM | POA: Diagnosis not present

## 2022-02-10 DIAGNOSIS — K8020 Calculus of gallbladder without cholecystitis without obstruction: Secondary | ICD-10-CM | POA: Diagnosis not present

## 2022-02-10 DIAGNOSIS — R195 Other fecal abnormalities: Secondary | ICD-10-CM

## 2022-02-10 MED ORDER — IOPAMIDOL (ISOVUE-300) INJECTION 61%
100.0000 mL | Freq: Once | INTRAVENOUS | Status: AC | PRN
  Administered 2022-02-10: 100 mL via INTRAVENOUS

## 2022-02-17 DIAGNOSIS — E1129 Type 2 diabetes mellitus with other diabetic kidney complication: Secondary | ICD-10-CM | POA: Diagnosis not present

## 2022-02-17 DIAGNOSIS — Z794 Long term (current) use of insulin: Secondary | ICD-10-CM | POA: Diagnosis not present

## 2022-02-17 DIAGNOSIS — I129 Hypertensive chronic kidney disease with stage 1 through stage 4 chronic kidney disease, or unspecified chronic kidney disease: Secondary | ICD-10-CM | POA: Diagnosis not present

## 2022-02-17 DIAGNOSIS — N1831 Chronic kidney disease, stage 3a: Secondary | ICD-10-CM | POA: Diagnosis not present

## 2022-02-17 DIAGNOSIS — E7800 Pure hypercholesterolemia, unspecified: Secondary | ICD-10-CM | POA: Diagnosis not present

## 2022-02-17 DIAGNOSIS — Z23 Encounter for immunization: Secondary | ICD-10-CM | POA: Diagnosis not present

## 2022-02-18 DIAGNOSIS — C44329 Squamous cell carcinoma of skin of other parts of face: Secondary | ICD-10-CM | POA: Diagnosis not present

## 2022-02-18 DIAGNOSIS — D2361 Other benign neoplasm of skin of right upper limb, including shoulder: Secondary | ICD-10-CM | POA: Diagnosis not present

## 2022-02-18 DIAGNOSIS — L570 Actinic keratosis: Secondary | ICD-10-CM | POA: Diagnosis not present

## 2022-02-18 DIAGNOSIS — L821 Other seborrheic keratosis: Secondary | ICD-10-CM | POA: Diagnosis not present

## 2022-02-18 DIAGNOSIS — D2362 Other benign neoplasm of skin of left upper limb, including shoulder: Secondary | ICD-10-CM | POA: Diagnosis not present

## 2022-02-18 DIAGNOSIS — D1801 Hemangioma of skin and subcutaneous tissue: Secondary | ICD-10-CM | POA: Diagnosis not present

## 2022-02-18 DIAGNOSIS — Z85828 Personal history of other malignant neoplasm of skin: Secondary | ICD-10-CM | POA: Diagnosis not present

## 2022-02-22 ENCOUNTER — Other Ambulatory Visit (HOSPITAL_COMMUNITY): Payer: Self-pay

## 2022-02-28 DIAGNOSIS — E1065 Type 1 diabetes mellitus with hyperglycemia: Secondary | ICD-10-CM | POA: Diagnosis not present

## 2022-03-15 DIAGNOSIS — M5489 Other dorsalgia: Secondary | ICD-10-CM | POA: Diagnosis not present

## 2022-03-15 DIAGNOSIS — D692 Other nonthrombocytopenic purpura: Secondary | ICD-10-CM | POA: Diagnosis not present

## 2022-03-15 DIAGNOSIS — I129 Hypertensive chronic kidney disease with stage 1 through stage 4 chronic kidney disease, or unspecified chronic kidney disease: Secondary | ICD-10-CM | POA: Diagnosis not present

## 2022-03-15 DIAGNOSIS — E1129 Type 2 diabetes mellitus with other diabetic kidney complication: Secondary | ICD-10-CM | POA: Diagnosis not present

## 2022-03-15 DIAGNOSIS — R413 Other amnesia: Secondary | ICD-10-CM | POA: Diagnosis not present

## 2022-03-15 DIAGNOSIS — E7800 Pure hypercholesterolemia, unspecified: Secondary | ICD-10-CM | POA: Diagnosis not present

## 2022-03-15 DIAGNOSIS — Z794 Long term (current) use of insulin: Secondary | ICD-10-CM | POA: Diagnosis not present

## 2022-03-15 DIAGNOSIS — N1831 Chronic kidney disease, stage 3a: Secondary | ICD-10-CM | POA: Diagnosis not present

## 2022-03-20 ENCOUNTER — Other Ambulatory Visit (HOSPITAL_COMMUNITY): Payer: Self-pay

## 2022-03-30 DIAGNOSIS — E1065 Type 1 diabetes mellitus with hyperglycemia: Secondary | ICD-10-CM | POA: Diagnosis not present

## 2022-04-30 DIAGNOSIS — E1065 Type 1 diabetes mellitus with hyperglycemia: Secondary | ICD-10-CM | POA: Diagnosis not present

## 2022-05-03 DIAGNOSIS — M170 Bilateral primary osteoarthritis of knee: Secondary | ICD-10-CM | POA: Diagnosis not present

## 2022-05-11 DIAGNOSIS — M6281 Muscle weakness (generalized): Secondary | ICD-10-CM | POA: Diagnosis not present

## 2022-05-11 DIAGNOSIS — M170 Bilateral primary osteoarthritis of knee: Secondary | ICD-10-CM | POA: Diagnosis not present

## 2022-05-22 ENCOUNTER — Other Ambulatory Visit (HOSPITAL_COMMUNITY): Payer: Self-pay

## 2022-05-29 IMAGING — DX DG KNEE COMPLETE 4+V*L*
4 series · 4 of 4 positions shown · non-contrast
Comparison: None.

CLINICAL DATA: Status post fall.

EXAM:
LEFT KNEE - COMPLETE 4+ VIEW

[knee ap]
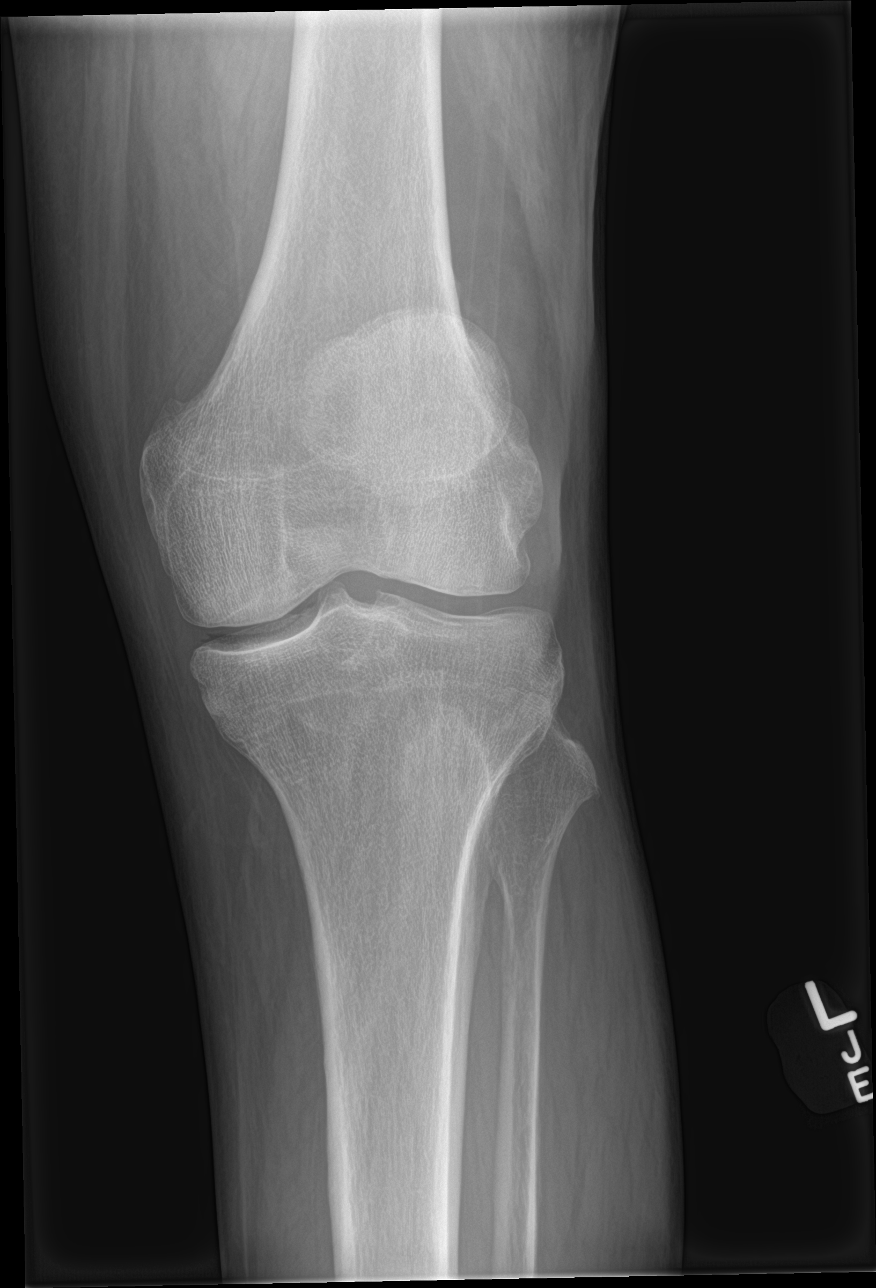

[knee lat]
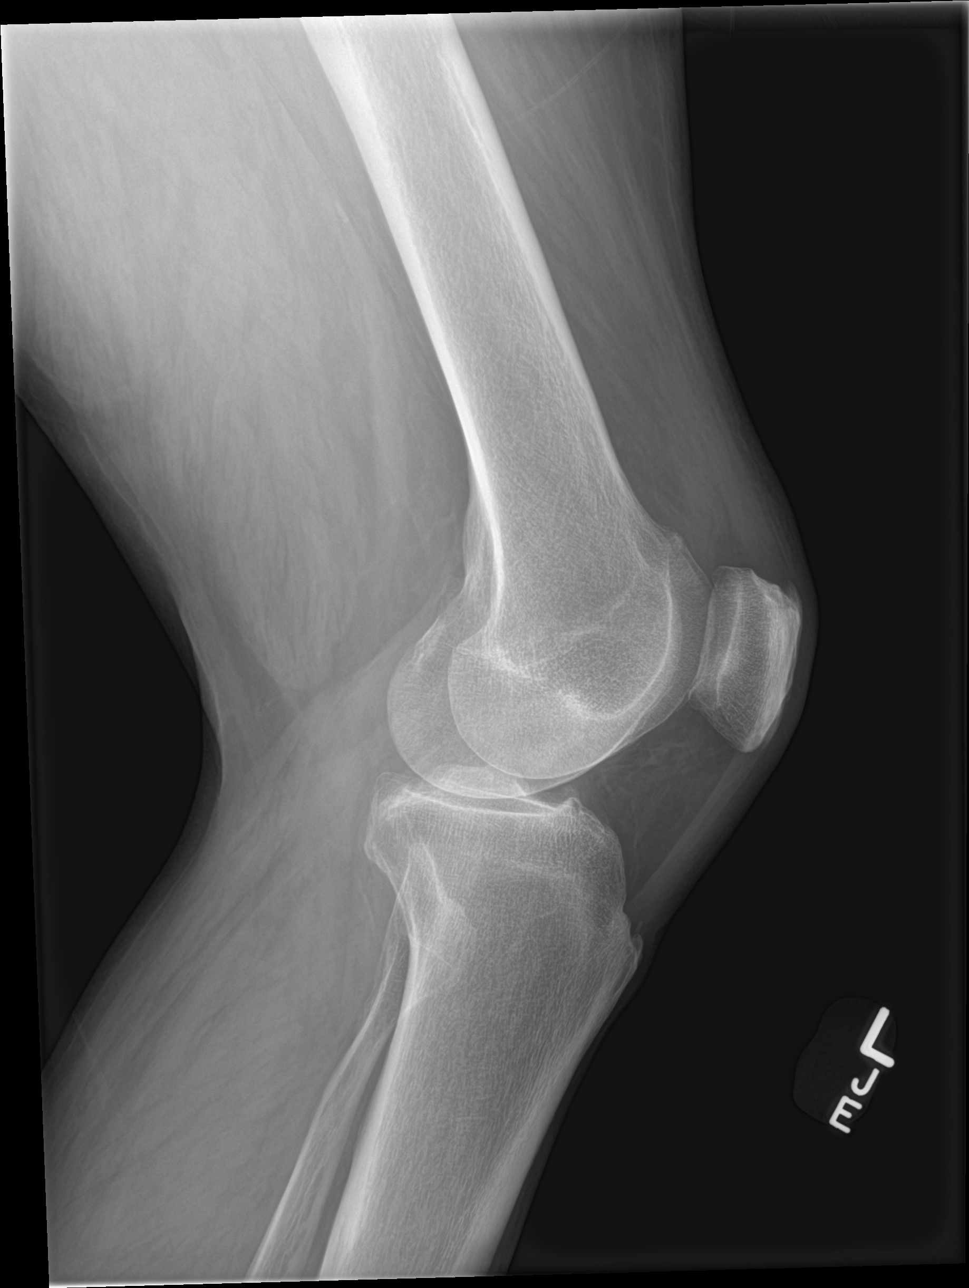

[knee obl (1 of 2)]
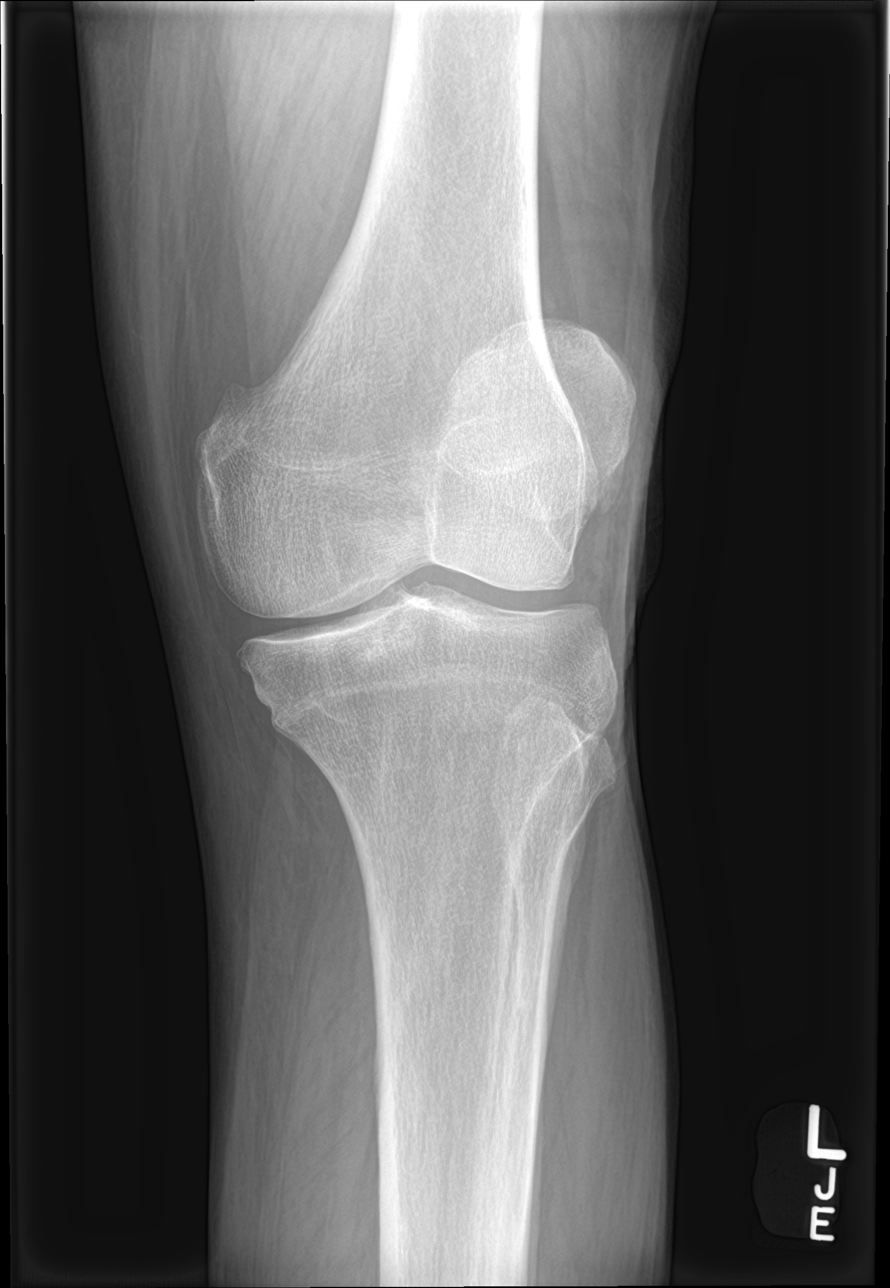

[knee obl (2 of 2)]
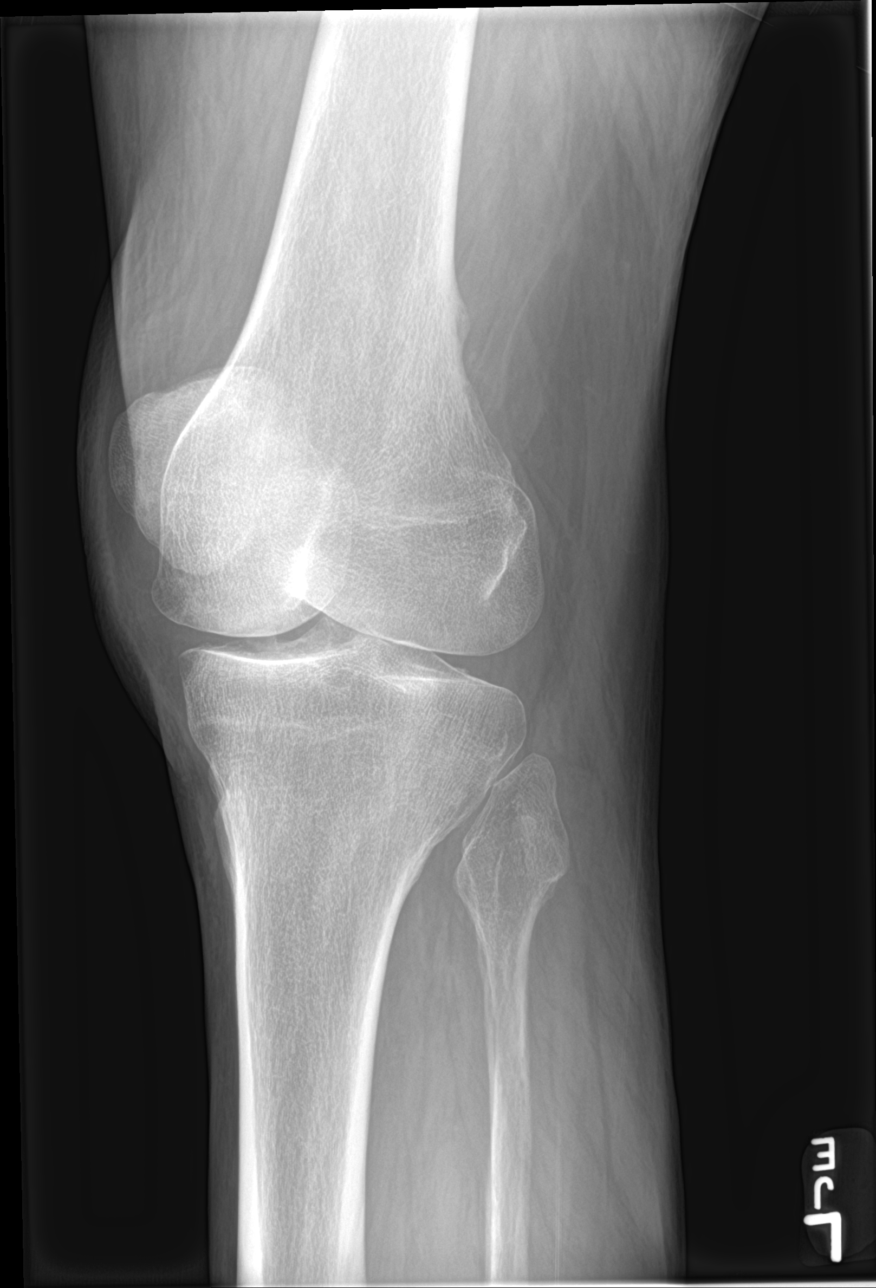

[4 of 4 positions shown; findings below may reference images not displayed]

FINDINGS: No evidence of an acute fracture or dislocation. Moderate severity
medial tibiofemoral compartment space narrowing is seen. A small
joint effusion is noted.
IMPRESSION: Small joint effusion without an acute osseous abnormality.

## 2022-05-31 DIAGNOSIS — E1065 Type 1 diabetes mellitus with hyperglycemia: Secondary | ICD-10-CM | POA: Diagnosis not present

## 2022-06-09 NOTE — Progress Notes (Unsigned)
PATIENT: [name] DOB: 30-May-1950  REASON FOR VISIT: follow up HISTORY FROM: patient Primary neurologist: Dr. Rexene Alberts  No chief complaint on file.    HISTORY OF PRESENT ILLNESS: Today 06/09/22:  LUCILLE RISDEN is a 72 y.o. male with a history of obstructive sleep apnea on CPAP. Returns today for follow-up.        11/24/2021: Mr. Street is a 72 year old male with a history of obstructive sleep apnea on CPAP.  He returns today for follow-up.  His download is below.  He states for the last 30 days he has not used it much because he was on vacation.  He states that he did take his machine with him but never set it up.  He also states that there are some nights he tends to fall asleep before putting it on.  He still struggles to use the CPAP.  Reports that he wakes up often at night.  Wife reports that he sleeps during the day and this is the cause of him not sleeping well at night.  Patient disagrees.    11/24/20: Mr. Derenne is a 72 year old male with a history of obstructive sleep apnea on CPAP.  He returns today for follow-up.  He states the first week of August he was on vacation and did not use his machine.  He states the nights he does have restless nights.  He does not feel the mask leaking most nights.  His download is below    11/21/19: Mr. Estepa  is a 72 year old male with a history of obstructive sleep apnea on CPAP.  He returns today for follow-up.  His download indicates that he use his machine 13 out of 30 days for compliance of 43%.  He uses machine greater than 4 hours each night.  On average he uses it 4 hours and 57 minutes.  His residual AHI is 3.1 on 11 cm of water with EPR of 3.  He reports that there are some nights that he forgets to put his machine on and falls asleep.  He also states that they went on vacation for a week and he did not take it with him.  HISTORY 05/24/19:   Mr. Slomka is a 72 year old male with a history of obstructive sleep apnea on CPAP.  He  returns today for follow-up.  His download indicates that he uses machine 20 out of 30 days for compliance of 67%.  He uses machine greater than 4 hours 17 days for compliance of 57%.  On average he uses his machine 5 hours and 2 minutes.  His residual AHI is 2.5 on 11 cm of water with EPR 3.  His leak in the 95th percentile is 34 L/min.  He reports that the last visit he never got a call from his DME company to have a mask refitting.  He states that most nights he is unable to use the machine continuously due to the leak.  He returns today for an evaluation.  REVIEW OF SYSTEMS: Out of a complete 14 system review of symptoms, the patient complains only of the following symptoms, and all other reviewed systems are negative.  ESS 12   ALLERGIES: Allergies  Allergen Reactions   Sulfamethoxazole-Trimethoprim Nausea Only    HOME MEDICATIONS: Outpatient Medications Prior to Visit  Medication Sig Dispense Refill   aspirin 81 MG tablet Take 81 mg by mouth daily.     Choline Fenofibrate (FENOFIBRIC ACID) 135 MG CPDR Take 1 tablet by mouth daily.  Continuous Blood Gluc Sensor (FREESTYLE LIBRE 2 SENSOR) MISC change every 14 days . to monitor blood glucose     fluticasone (FLONASE) 50 MCG/ACT nasal spray 1 spray daily as needed.     furosemide (LASIX) 40 MG tablet Take 40 mg by mouth daily.      gabapentin (NEURONTIN) 100 MG capsule TAKE ONE CAPSULE BY MOUTH 1 TIME A DAY AS NEEDED  0   HUMALOG KWIKPEN 100 UNIT/ML KiwkPen INJECT 16 UNITS WITH BIGGEST MEAL  3   INVOKANA 300 MG TABS tablet Take 300 mg by mouth daily.  6   Melatonin 10 MG TABS Take 1 tablet by mouth daily.     NOVOTWIST 32G X 5 MM MISC Pt uses 5 needles daily     omeprazole (PRILOSEC) 20 MG capsule Take 20 mg by mouth daily.     ONE TOUCH ULTRA TEST test strip      oxyCODONE-acetaminophen (PERCOCET/ROXICET) 5-325 MG tablet Take 1 tablet by mouth every 6 (six) hours as needed for severe pain. 10 tablet 0   ramipril (ALTACE) 10 MG  capsule Take 10 mg by mouth daily.      Semaglutide, 1 MG/DOSE, (OZEMPIC, 1 MG/DOSE,) 4 MG/3ML SOPN Inject 1 mg into the skin once a week 12 mL 2   Semaglutide,0.25 or 0.'5MG'$ /DOS, (OZEMPIC, 0.25 OR 0.5 MG/DOSE,) 2 MG/1.5ML SOPN Inject 1 mg into the skin once a week.     TRESIBA FLEXTOUCH 200 UNIT/ML FlexTouch Pen SMARTSIG:60 Unit(s) SUB-Q Daily     VYTORIN 10-40 MG per tablet Take 1 tablet by mouth daily.      No facility-administered medications prior to visit.    PAST MEDICAL HISTORY: Past Medical History:  Diagnosis Date   Allergy    Arthritis    left shoulder and knees    Chronic kidney disease    kidney stones   Constipation    occasionally    DM (diabetes mellitus) ([place])    takes Victoza daily;Humalog and Levemir daily   GERD (gastroesophageal reflux disease)    but doesn't take any meds   Headache(784.0)    occasionally to rare    Hemorrhoids    History of gout    HTN (hypertension)    takes Ramipril daily   Hypercholesteremia    Hyperlipidemia    takes Vytorin daily   IBS (irritable bowel syndrome)    Insomnia    doesn't require meds   Irritable bowel syndrome    Joint pain    Morbid obesity ([place])    Obstructive sleep apnea (adult) (pediatric)    wears cpap    Osteoarthritis of right shoulder region 02/13/2013   Pain in limb    Peripheral edema    takes Furosemide daily   Pneumonia    hx of;about 59yr ago   Psoriasis    Shortness of breath    with exertion   Sleep apnea    Snoring 12/07/2012    PAST SURGICAL HISTORY: Past Surgical History:  Procedure Laterality Date   ANKLE SURGERY Right    COLONOSCOPY  2007,2003   ESOPHAGOGASTRODUODENOSCOPY     KNEE SURGERY Bilateral    TOTAL SHOULDER ARTHROPLASTY Right 02/13/2013   Procedure: TOTAL SHOULDER ARTHROPLASTY;  Surgeon: JJohnny Bridge MD;  [place] [facility]  Service: Orthopedics;  Laterality: Right;   TOTAL SHOULDER REPLACEMENT Right 02/13/2013   Dr LMardelle Matte   FAMILY HISTORY: Family History  Problem  Relation Age of Onset   Diabetes Mother    Colon cancer  Neg Hx    Colon polyps Neg Hx    Esophageal cancer Neg Hx    Rectal cancer Neg Hx    Stomach cancer Neg Hx     SOCIAL HISTORY: Social History   Socioeconomic History   Marital status: Married    Spouse name: Not on file   Number of children: Not on file   Years of education: Not on file   Highest education level: Not on file  Occupational History   Not on file  Tobacco Use   Smoking status: Never   Smokeless tobacco: Never  Substance and Sexual Activity   Alcohol use: Yes    Alcohol/week: 0.0 standard drinks of alcohol    Comment: occasionally beer   Drug use: No   Sexual activity: Yes  Other Topics Concern   Not on file  Social History Narrative   Not on file   Social Determinants of Health   Financial Resource Strain: Not on file  Food Insecurity: Not on file  Transportation Needs: Not on file  Physical Activity: Not on file  Stress: Not on file  Social Connections: Not on file  Intimate Partner Violence: Not on file      PHYSICAL EXAM  There were no vitals filed for this visit.   There is no height or weight on file to calculate BMI.  Generalized: Well developed, in no acute distress  Chest: Lungs clear to auscultation bilaterally  Neurological examination  Mentation: Alert oriented to time, place, history taking. Follows all commands speech and language fluent Cranial nerve II-XII: Extraocular movements were full, visual field were full on confrontational test Head turning and shoulder shrug  were normal and symmetric.  Neck circumference 19 inches, Mallampati 3+ Gait and station: Gait is normal.    DIAGNOSTIC DATA (LABS, IMAGING, TESTING) - I reviewed patient records, labs, notes, testing and imaging myself where available.  Lab Results  Component Value Date   WBC 13.1 (H) 02/14/2013   HGB 12.6 (L) 02/14/2013   HCT 37.5 (L) 02/14/2013   MCV 96.9 02/14/2013   PLT 187 02/14/2013       Component Value Date/Time   NA 136 02/14/2013 0534   K 4.6 02/14/2013 0534   CL 104 02/14/2013 0534   CO2 23 02/14/2013 0534   GLUCOSE 231 (H) 02/14/2013 0534   BUN 21 02/14/2013 0534   CREATININE 1.25 02/14/2013 0534   CALCIUM 8.4 02/14/2013 0534   GFRNONAA 60 (L) 02/14/2013 0534   GFRAA 69 (L) 02/14/2013 0534      ASSESSMENT AND PLAN 72 y.o. year old male  has a past medical history of Allergy, Arthritis, Chronic kidney disease, Constipation, DM (diabetes mellitus) ([place]), GERD (gastroesophageal reflux disease), Headache(784.0), Hemorrhoids, History of gout, HTN (hypertension), Hypercholesteremia, Hyperlipidemia, IBS (irritable bowel syndrome), Insomnia, Irritable bowel syndrome, Joint pain, Morbid obesity ([place]), Obstructive sleep apnea (adult) (pediatric), Osteoarthritis of right shoulder region (02/13/2013), Pain in limb, Peripheral edema, Pneumonia, Psoriasis, Shortness of breath, Sleep apnea, and Snoring (12/07/2012). here with:  OSA on CPAP  - CPAP compliance is suboptimal -Residual AHI has slightly increased.  He does have a leak.  However he is already done a mask refitting.  We will adjust his pressure to AutoSet 7 to 15 cm of water to see if he finds this more comfortable. -Patient will call in 30 to 45 days for me to look at a download. - Encourage patient to use CPAP nightly and > 4 hours each night - F/U in 6 months  or sooner if needed   Ward Givens, MSN, NP-C 06/09/2022, 4:00 PM [facility] [address] [place], [place] [address] [phone]

## 2022-06-10 ENCOUNTER — Ambulatory Visit: Payer: Medicare PPO | Admitting: Adult Health

## 2022-06-10 ENCOUNTER — Encounter: Payer: Self-pay | Admitting: Adult Health

## 2022-06-10 VITALS — BP 141/59 | HR 62 | Ht 70.0 in | Wt 253.0 lb

## 2022-06-10 DIAGNOSIS — G4733 Obstructive sleep apnea (adult) (pediatric): Secondary | ICD-10-CM

## 2022-06-10 DIAGNOSIS — N1831 Chronic kidney disease, stage 3a: Secondary | ICD-10-CM | POA: Diagnosis not present

## 2022-06-10 DIAGNOSIS — I129 Hypertensive chronic kidney disease with stage 1 through stage 4 chronic kidney disease, or unspecified chronic kidney disease: Secondary | ICD-10-CM | POA: Diagnosis not present

## 2022-06-10 DIAGNOSIS — E1129 Type 2 diabetes mellitus with other diabetic kidney complication: Secondary | ICD-10-CM | POA: Diagnosis not present

## 2022-06-10 DIAGNOSIS — Z794 Long term (current) use of insulin: Secondary | ICD-10-CM | POA: Diagnosis not present

## 2022-06-10 DIAGNOSIS — M5489 Other dorsalgia: Secondary | ICD-10-CM | POA: Diagnosis not present

## 2022-06-10 DIAGNOSIS — R1031 Right lower quadrant pain: Secondary | ICD-10-CM | POA: Diagnosis not present

## 2022-06-10 DIAGNOSIS — R1030 Lower abdominal pain, unspecified: Secondary | ICD-10-CM | POA: Diagnosis not present

## 2022-06-18 DIAGNOSIS — M25551 Pain in right hip: Secondary | ICD-10-CM | POA: Diagnosis not present

## 2022-06-18 DIAGNOSIS — M5431 Sciatica, right side: Secondary | ICD-10-CM | POA: Diagnosis not present

## 2022-06-23 DIAGNOSIS — E7800 Pure hypercholesterolemia, unspecified: Secondary | ICD-10-CM | POA: Diagnosis not present

## 2022-06-23 DIAGNOSIS — I129 Hypertensive chronic kidney disease with stage 1 through stage 4 chronic kidney disease, or unspecified chronic kidney disease: Secondary | ICD-10-CM | POA: Diagnosis not present

## 2022-06-23 DIAGNOSIS — N1831 Chronic kidney disease, stage 3a: Secondary | ICD-10-CM | POA: Diagnosis not present

## 2022-06-23 DIAGNOSIS — Z794 Long term (current) use of insulin: Secondary | ICD-10-CM | POA: Diagnosis not present

## 2022-06-23 DIAGNOSIS — E1129 Type 2 diabetes mellitus with other diabetic kidney complication: Secondary | ICD-10-CM | POA: Diagnosis not present

## 2022-06-29 DIAGNOSIS — E1065 Type 1 diabetes mellitus with hyperglycemia: Secondary | ICD-10-CM | POA: Diagnosis not present

## 2022-06-29 DIAGNOSIS — G4733 Obstructive sleep apnea (adult) (pediatric): Secondary | ICD-10-CM | POA: Diagnosis not present

## 2022-07-13 ENCOUNTER — Other Ambulatory Visit (HOSPITAL_COMMUNITY): Payer: Self-pay

## 2022-07-14 ENCOUNTER — Other Ambulatory Visit (HOSPITAL_COMMUNITY): Payer: Self-pay

## 2022-07-14 MED ORDER — OZEMPIC (1 MG/DOSE) 4 MG/3ML ~~LOC~~ SOPN
1.0000 mg | PEN_INJECTOR | SUBCUTANEOUS | 3 refills | Status: DC
  Filled 2022-07-14: qty 3, 28d supply, fill #0
  Filled 2022-08-11: qty 3, 28d supply, fill #1
  Filled 2022-09-16: qty 3, 28d supply, fill #2
  Filled 2022-10-12: qty 3, 28d supply, fill #3

## 2022-07-30 DIAGNOSIS — E1065 Type 1 diabetes mellitus with hyperglycemia: Secondary | ICD-10-CM | POA: Diagnosis not present

## 2022-08-05 DIAGNOSIS — H524 Presbyopia: Secondary | ICD-10-CM | POA: Diagnosis not present

## 2022-08-05 DIAGNOSIS — H2513 Age-related nuclear cataract, bilateral: Secondary | ICD-10-CM | POA: Diagnosis not present

## 2022-08-05 DIAGNOSIS — E119 Type 2 diabetes mellitus without complications: Secondary | ICD-10-CM | POA: Diagnosis not present

## 2022-08-11 ENCOUNTER — Other Ambulatory Visit (HOSPITAL_COMMUNITY): Payer: Self-pay

## 2022-08-11 HISTORY — PX: CATARACT EXTRACTION: SUR2

## 2022-08-24 DIAGNOSIS — H2511 Age-related nuclear cataract, right eye: Secondary | ICD-10-CM | POA: Diagnosis not present

## 2022-08-24 DIAGNOSIS — H25811 Combined forms of age-related cataract, right eye: Secondary | ICD-10-CM | POA: Diagnosis not present

## 2022-08-24 DIAGNOSIS — Z961 Presence of intraocular lens: Secondary | ICD-10-CM | POA: Diagnosis not present

## 2022-08-29 DIAGNOSIS — E1065 Type 1 diabetes mellitus with hyperglycemia: Secondary | ICD-10-CM | POA: Diagnosis not present

## 2022-08-30 DIAGNOSIS — L853 Xerosis cutis: Secondary | ICD-10-CM | POA: Diagnosis not present

## 2022-08-30 DIAGNOSIS — E1129 Type 2 diabetes mellitus with other diabetic kidney complication: Secondary | ICD-10-CM | POA: Diagnosis not present

## 2022-08-30 DIAGNOSIS — L03115 Cellulitis of right lower limb: Secondary | ICD-10-CM | POA: Diagnosis not present

## 2022-08-30 DIAGNOSIS — D692 Other nonthrombocytopenic purpura: Secondary | ICD-10-CM | POA: Diagnosis not present

## 2022-08-30 DIAGNOSIS — I872 Venous insufficiency (chronic) (peripheral): Secondary | ICD-10-CM | POA: Diagnosis not present

## 2022-09-07 DIAGNOSIS — H25812 Combined forms of age-related cataract, left eye: Secondary | ICD-10-CM | POA: Diagnosis not present

## 2022-09-07 DIAGNOSIS — Z961 Presence of intraocular lens: Secondary | ICD-10-CM | POA: Diagnosis not present

## 2022-09-07 DIAGNOSIS — H2512 Age-related nuclear cataract, left eye: Secondary | ICD-10-CM | POA: Diagnosis not present

## 2022-09-08 ENCOUNTER — Encounter: Payer: Self-pay | Admitting: Gastroenterology

## 2022-09-16 ENCOUNTER — Other Ambulatory Visit (HOSPITAL_COMMUNITY): Payer: Self-pay

## 2022-09-16 DIAGNOSIS — R7989 Other specified abnormal findings of blood chemistry: Secondary | ICD-10-CM | POA: Diagnosis not present

## 2022-09-16 DIAGNOSIS — E7800 Pure hypercholesterolemia, unspecified: Secondary | ICD-10-CM | POA: Diagnosis not present

## 2022-09-16 DIAGNOSIS — Z125 Encounter for screening for malignant neoplasm of prostate: Secondary | ICD-10-CM | POA: Diagnosis not present

## 2022-09-16 DIAGNOSIS — E1129 Type 2 diabetes mellitus with other diabetic kidney complication: Secondary | ICD-10-CM | POA: Diagnosis not present

## 2022-09-23 ENCOUNTER — Other Ambulatory Visit (HOSPITAL_COMMUNITY): Payer: Self-pay

## 2022-09-23 DIAGNOSIS — Z1212 Encounter for screening for malignant neoplasm of rectum: Secondary | ICD-10-CM | POA: Diagnosis not present

## 2022-09-23 DIAGNOSIS — N1831 Chronic kidney disease, stage 3a: Secondary | ICD-10-CM | POA: Diagnosis not present

## 2022-09-23 DIAGNOSIS — Z1331 Encounter for screening for depression: Secondary | ICD-10-CM | POA: Diagnosis not present

## 2022-09-23 DIAGNOSIS — E7800 Pure hypercholesterolemia, unspecified: Secondary | ICD-10-CM | POA: Diagnosis not present

## 2022-09-23 DIAGNOSIS — I1310 Hypertensive heart and chronic kidney disease without heart failure, with stage 1 through stage 4 chronic kidney disease, or unspecified chronic kidney disease: Secondary | ICD-10-CM | POA: Diagnosis not present

## 2022-09-23 DIAGNOSIS — E1165 Type 2 diabetes mellitus with hyperglycemia: Secondary | ICD-10-CM | POA: Diagnosis not present

## 2022-09-23 DIAGNOSIS — Z794 Long term (current) use of insulin: Secondary | ICD-10-CM | POA: Diagnosis not present

## 2022-09-23 DIAGNOSIS — E1129 Type 2 diabetes mellitus with other diabetic kidney complication: Secondary | ICD-10-CM | POA: Diagnosis not present

## 2022-09-23 DIAGNOSIS — Z0000 Encounter for general adult medical examination without abnormal findings: Secondary | ICD-10-CM | POA: Diagnosis not present

## 2022-09-23 DIAGNOSIS — R82998 Other abnormal findings in urine: Secondary | ICD-10-CM | POA: Diagnosis not present

## 2022-09-23 DIAGNOSIS — Z1339 Encounter for screening examination for other mental health and behavioral disorders: Secondary | ICD-10-CM | POA: Diagnosis not present

## 2022-09-23 DIAGNOSIS — D692 Other nonthrombocytopenic purpura: Secondary | ICD-10-CM | POA: Diagnosis not present

## 2022-09-23 MED ORDER — OZEMPIC (1 MG/DOSE) 4 MG/3ML ~~LOC~~ SOPN
1.0000 mg | PEN_INJECTOR | SUBCUTANEOUS | 3 refills | Status: DC
  Filled 2022-09-23 – 2022-10-12 (×2): qty 9, 84d supply, fill #0
  Filled 2023-01-06: qty 9, 84d supply, fill #1
  Filled 2023-03-24: qty 9, 84d supply, fill #2
  Filled 2023-07-07: qty 9, 84d supply, fill #3

## 2022-09-29 DIAGNOSIS — E1065 Type 1 diabetes mellitus with hyperglycemia: Secondary | ICD-10-CM | POA: Diagnosis not present

## 2022-10-12 ENCOUNTER — Other Ambulatory Visit (HOSPITAL_COMMUNITY): Payer: Self-pay

## 2022-10-13 ENCOUNTER — Other Ambulatory Visit (HOSPITAL_COMMUNITY): Payer: Self-pay

## 2022-10-22 DIAGNOSIS — M170 Bilateral primary osteoarthritis of knee: Secondary | ICD-10-CM | POA: Diagnosis not present

## 2022-10-29 DIAGNOSIS — E1065 Type 1 diabetes mellitus with hyperglycemia: Secondary | ICD-10-CM | POA: Diagnosis not present

## 2022-11-29 DIAGNOSIS — E1065 Type 1 diabetes mellitus with hyperglycemia: Secondary | ICD-10-CM | POA: Diagnosis not present

## 2022-11-30 DIAGNOSIS — G4733 Obstructive sleep apnea (adult) (pediatric): Secondary | ICD-10-CM | POA: Diagnosis not present

## 2022-12-28 DIAGNOSIS — Z23 Encounter for immunization: Secondary | ICD-10-CM | POA: Diagnosis not present

## 2022-12-28 DIAGNOSIS — E7800 Pure hypercholesterolemia, unspecified: Secondary | ICD-10-CM | POA: Diagnosis not present

## 2022-12-28 DIAGNOSIS — I129 Hypertensive chronic kidney disease with stage 1 through stage 4 chronic kidney disease, or unspecified chronic kidney disease: Secondary | ICD-10-CM | POA: Diagnosis not present

## 2022-12-28 DIAGNOSIS — N1831 Chronic kidney disease, stage 3a: Secondary | ICD-10-CM | POA: Diagnosis not present

## 2022-12-28 DIAGNOSIS — Z794 Long term (current) use of insulin: Secondary | ICD-10-CM | POA: Diagnosis not present

## 2022-12-28 DIAGNOSIS — E1129 Type 2 diabetes mellitus with other diabetic kidney complication: Secondary | ICD-10-CM | POA: Diagnosis not present

## 2022-12-30 DIAGNOSIS — E1065 Type 1 diabetes mellitus with hyperglycemia: Secondary | ICD-10-CM | POA: Diagnosis not present

## 2023-01-13 DIAGNOSIS — R21 Rash and other nonspecific skin eruption: Secondary | ICD-10-CM | POA: Diagnosis not present

## 2023-01-13 DIAGNOSIS — R0981 Nasal congestion: Secondary | ICD-10-CM | POA: Diagnosis not present

## 2023-01-13 DIAGNOSIS — J029 Acute pharyngitis, unspecified: Secondary | ICD-10-CM | POA: Diagnosis not present

## 2023-01-13 DIAGNOSIS — U071 COVID-19: Secondary | ICD-10-CM | POA: Diagnosis not present

## 2023-01-13 DIAGNOSIS — Z1152 Encounter for screening for COVID-19: Secondary | ICD-10-CM | POA: Diagnosis not present

## 2023-01-13 DIAGNOSIS — L309 Dermatitis, unspecified: Secondary | ICD-10-CM | POA: Diagnosis not present

## 2023-01-13 DIAGNOSIS — I129 Hypertensive chronic kidney disease with stage 1 through stage 4 chronic kidney disease, or unspecified chronic kidney disease: Secondary | ICD-10-CM | POA: Diagnosis not present

## 2023-01-13 DIAGNOSIS — R059 Cough, unspecified: Secondary | ICD-10-CM | POA: Diagnosis not present

## 2023-01-13 DIAGNOSIS — G4733 Obstructive sleep apnea (adult) (pediatric): Secondary | ICD-10-CM | POA: Diagnosis not present

## 2023-01-29 DIAGNOSIS — E1065 Type 1 diabetes mellitus with hyperglycemia: Secondary | ICD-10-CM | POA: Diagnosis not present

## 2023-02-21 DIAGNOSIS — Z85828 Personal history of other malignant neoplasm of skin: Secondary | ICD-10-CM | POA: Diagnosis not present

## 2023-02-21 DIAGNOSIS — L820 Inflamed seborrheic keratosis: Secondary | ICD-10-CM | POA: Diagnosis not present

## 2023-02-21 DIAGNOSIS — L821 Other seborrheic keratosis: Secondary | ICD-10-CM | POA: Diagnosis not present

## 2023-02-21 DIAGNOSIS — L570 Actinic keratosis: Secondary | ICD-10-CM | POA: Diagnosis not present

## 2023-02-21 DIAGNOSIS — D225 Melanocytic nevi of trunk: Secondary | ICD-10-CM | POA: Diagnosis not present

## 2023-03-01 DIAGNOSIS — E1065 Type 1 diabetes mellitus with hyperglycemia: Secondary | ICD-10-CM | POA: Diagnosis not present

## 2023-03-23 DIAGNOSIS — E1129 Type 2 diabetes mellitus with other diabetic kidney complication: Secondary | ICD-10-CM | POA: Diagnosis not present

## 2023-03-23 DIAGNOSIS — E7800 Pure hypercholesterolemia, unspecified: Secondary | ICD-10-CM | POA: Diagnosis not present

## 2023-03-23 DIAGNOSIS — Z794 Long term (current) use of insulin: Secondary | ICD-10-CM | POA: Diagnosis not present

## 2023-03-23 DIAGNOSIS — D692 Other nonthrombocytopenic purpura: Secondary | ICD-10-CM | POA: Diagnosis not present

## 2023-03-23 DIAGNOSIS — I1310 Hypertensive heart and chronic kidney disease without heart failure, with stage 1 through stage 4 chronic kidney disease, or unspecified chronic kidney disease: Secondary | ICD-10-CM | POA: Diagnosis not present

## 2023-03-23 DIAGNOSIS — N1831 Chronic kidney disease, stage 3a: Secondary | ICD-10-CM | POA: Diagnosis not present

## 2023-03-23 DIAGNOSIS — K589 Irritable bowel syndrome without diarrhea: Secondary | ICD-10-CM | POA: Diagnosis not present

## 2023-03-24 ENCOUNTER — Other Ambulatory Visit: Payer: Self-pay

## 2023-03-24 ENCOUNTER — Other Ambulatory Visit (HOSPITAL_COMMUNITY): Payer: Self-pay

## 2023-03-29 ENCOUNTER — Other Ambulatory Visit (HOSPITAL_COMMUNITY): Payer: Self-pay

## 2023-03-31 DIAGNOSIS — E1065 Type 1 diabetes mellitus with hyperglycemia: Secondary | ICD-10-CM | POA: Diagnosis not present

## 2023-04-11 DIAGNOSIS — G4733 Obstructive sleep apnea (adult) (pediatric): Secondary | ICD-10-CM | POA: Diagnosis not present

## 2023-04-14 DIAGNOSIS — I129 Hypertensive chronic kidney disease with stage 1 through stage 4 chronic kidney disease, or unspecified chronic kidney disease: Secondary | ICD-10-CM | POA: Diagnosis not present

## 2023-04-14 DIAGNOSIS — R051 Acute cough: Secondary | ICD-10-CM | POA: Diagnosis not present

## 2023-04-14 DIAGNOSIS — J101 Influenza due to other identified influenza virus with other respiratory manifestations: Secondary | ICD-10-CM | POA: Diagnosis not present

## 2023-04-14 DIAGNOSIS — Z1152 Encounter for screening for COVID-19: Secondary | ICD-10-CM | POA: Diagnosis not present

## 2023-04-14 DIAGNOSIS — R5383 Other fatigue: Secondary | ICD-10-CM | POA: Diagnosis not present

## 2023-04-14 DIAGNOSIS — R0981 Nasal congestion: Secondary | ICD-10-CM | POA: Diagnosis not present

## 2023-04-14 DIAGNOSIS — J029 Acute pharyngitis, unspecified: Secondary | ICD-10-CM | POA: Diagnosis not present

## 2023-04-18 ENCOUNTER — Ambulatory Visit (AMBULATORY_SURGERY_CENTER): Payer: Medicare PPO

## 2023-04-18 VITALS — Ht 69.5 in | Wt 250.0 lb

## 2023-04-18 DIAGNOSIS — Z860100 Personal history of colon polyps, unspecified: Secondary | ICD-10-CM

## 2023-04-18 MED ORDER — NA SULFATE-K SULFATE-MG SULF 17.5-3.13-1.6 GM/177ML PO SOLN: 1.0000 | Freq: Once | ORAL | 0 refills | Status: AC

## 2023-04-18 NOTE — Progress Notes (Signed)
No egg or soy allergy known to patient  No issues known to pt with past sedation with any surgeries or procedures Patient denies ever being told they had issues or difficulty with intubation  No FH of Malignant Hyperthermia Pt is not on diet pills Pt is not on  home 02  Pt is not on blood thinners  Pt denies issues with constipation  No A fib or A flutter Have any cardiac testing pending--no Ambulates independently Patient's chart reviewed by Cathlyn Parsons CNRA prior to previsit and patient appropriate for the [facility].  Previsit completed and red dot placed by patient's name on their procedure day (on provider's schedule).    Pt instructed to use [email or web address] or GoodRx for a price reduction on prep

## 2023-04-22 ENCOUNTER — Encounter: Payer: Self-pay | Admitting: Gastroenterology

## 2023-04-27 ENCOUNTER — Ambulatory Visit (AMBULATORY_SURGERY_CENTER): Payer: Medicare PPO | Admitting: Gastroenterology

## 2023-04-27 ENCOUNTER — Encounter: Payer: Self-pay | Admitting: Gastroenterology

## 2023-04-27 VITALS — BP 95/77 | HR 63 | Temp 97.3°F | Resp 11 | Wt 250.0 lb

## 2023-04-27 DIAGNOSIS — K635 Polyp of colon: Secondary | ICD-10-CM

## 2023-04-27 DIAGNOSIS — E119 Type 2 diabetes mellitus without complications: Secondary | ICD-10-CM | POA: Diagnosis not present

## 2023-04-27 DIAGNOSIS — K5730 Diverticulosis of large intestine without perforation or abscess without bleeding: Secondary | ICD-10-CM | POA: Diagnosis not present

## 2023-04-27 DIAGNOSIS — D124 Benign neoplasm of descending colon: Secondary | ICD-10-CM | POA: Diagnosis not present

## 2023-04-27 DIAGNOSIS — I10 Essential (primary) hypertension: Secondary | ICD-10-CM | POA: Diagnosis not present

## 2023-04-27 DIAGNOSIS — K5520 Angiodysplasia of colon without hemorrhage: Secondary | ICD-10-CM | POA: Diagnosis not present

## 2023-04-27 DIAGNOSIS — Z860100 Personal history of colon polyps, unspecified: Secondary | ICD-10-CM

## 2023-04-27 DIAGNOSIS — D123 Benign neoplasm of transverse colon: Secondary | ICD-10-CM | POA: Diagnosis not present

## 2023-04-27 DIAGNOSIS — K641 Second degree hemorrhoids: Secondary | ICD-10-CM | POA: Diagnosis not present

## 2023-04-27 DIAGNOSIS — Z1211 Encounter for screening for malignant neoplasm of colon: Secondary | ICD-10-CM

## 2023-04-27 DIAGNOSIS — D125 Benign neoplasm of sigmoid colon: Secondary | ICD-10-CM | POA: Diagnosis not present

## 2023-04-27 DIAGNOSIS — G4733 Obstructive sleep apnea (adult) (pediatric): Secondary | ICD-10-CM | POA: Diagnosis not present

## 2023-04-27 MED ORDER — SODIUM CHLORIDE 0.9 % IV SOLN: 500.0000 mL | Freq: Once | INTRAVENOUS | Status: DC

## 2023-04-27 NOTE — Progress Notes (Signed)
 Vss nad trans to pacu

## 2023-04-27 NOTE — Op Note (Signed)
 [facility] Patient Name: Jamie Figueroa Procedure Date: 04/27/2023 7:56 AM MRN: [id number] Endoscopist: Yong Henle , MD, [id number] Age: 73 Referring MD:  Date of Birth: May 04, 1950 Gender: Male Account #: [id number] Procedure:                Colonoscopy Indications:              Surveillance: Personal history of adenomatous                            polyps on last colonoscopy 5 years ago Medicines:                Monitored Anesthesia Care Procedure:                Pre-Anesthesia Assessment:                           - Prior to the procedure, a History and Physical                            was performed, and patient medications and                            allergies were reviewed. The patient's tolerance of                            previous anesthesia was also reviewed. The risks                            and benefits of the procedure and the sedation                            options and risks were discussed with the patient.                            All questions were answered, and informed consent                            was obtained. Prior Anticoagulants: The patient has                            taken no anticoagulant or antiplatelet agents                            except for aspirin . ASA Grade Assessment: III - A                            patient with severe systemic disease. After                            reviewing the risks and benefits, the patient was                            deemed in satisfactory condition to undergo the  procedure.                           After obtaining informed consent, the colonoscope                            was passed under direct vision. Throughout the                            procedure, the patient's blood pressure, pulse, and                            oxygen saturations were monitored continuously. The                            CF [id number] #[id number] was introduced through the  anus                            and advanced to the 3 cm into the ileum. The                            colonoscopy was performed without difficulty. The                            patient tolerated the procedure. The quality of the                            bowel preparation was adequate. The terminal ileum,                            ileocecal valve, appendiceal orifice, and rectum                            were photographed. Scope In: 8:06:21 AM Scope Out: 8:21:32 AM Scope Withdrawal Time: 0 hours 13 minutes 4 seconds  Total Procedure Duration: 0 hours 15 minutes 11 seconds  Findings:                 The digital rectal exam findings include                            hemorrhoids. Pertinent negatives include no                            palpable rectal lesions.                           The terminal ileum and ileocecal valve appeared                            normal.                           Four sessile polyps were found in the sigmoid colon                            (  1), descending colon (2) and hepatic flexure (1).                            The polyps were 2 to 10 mm in size. These polyps                            were removed with a cold snare. Resection and                            retrieval were complete.                           Multiple small-mouthed diverticula were found in                            the recto-sigmoid colon and sigmoid colon.                           A single small diffuse angioectasia with typical                            arborization was found in the recto-sigmoid colon.                           Non-bleeding non-thrombosed internal hemorrhoids                            were found during retroflexion, during perianal                            exam and during digital exam. The hemorrhoids were                            Grade II (internal hemorrhoids that prolapse but                            reduce spontaneously). Complications:             No immediate complications. Estimated Blood Loss:     Estimated blood loss was minimal. Impression:               - Hemorrhoids found on digital rectal exam.                           - The examined portion of the ileum was normal.                           - Four 2 to 10 mm polyps in the sigmoid colon, in                            the descending colon and at the hepatic flexure,                            removed with a cold snare. Resected and retrieved.                           -  Diverticulosis in the recto-sigmoid colon and in                            the sigmoid colon.                           - A single colonic angioectasia.                           - Non-bleeding non-thrombosed internal hemorrhoids. Recommendation:           - The patient will be observed post-procedure,                            until all discharge criteria are met.                           - Discharge patient to home.                           - Patient has a contact number available for                            emergencies. The signs and symptoms of potential                            delayed complications were discussed with the                            patient. Return to normal activities tomorrow.                            Written discharge instructions were provided to the                            patient.                           - High fiber diet.                           - Use FiberCon 1-2 tablets PO daily.                           - Continue present medications.                           - Await pathology results.                           - Repeat colonoscopy in 3 years for surveillance,                            pending patient's medical history at the time.                           - The findings and recommendations were  discussed                            with the patient.                           - The findings and recommendations were discussed                             with the patient's family. Yong Henle, MD 04/27/2023 8:33:14 AM

## 2023-04-27 NOTE — Progress Notes (Signed)
 Pt's states no medical or surgical changes since previsit or office visit.

## 2023-04-27 NOTE — Patient Instructions (Signed)
-   High fiber diet. - Use FiberCon 1-2 tablets PO daily. - Continue present medications. - Await pathology results   YOU HAD AN ENDOSCOPIC PROCEDURE TODAY AT THE [facility]:   Refer to the procedure report that was given to you for any specific questions about what was found during the examination.  If the procedure report does not answer your questions, please call your gastroenterologist to clarify.  If you requested that your care partner not be given the details of your procedure findings, then the procedure report has been included in a sealed envelope for you to review at your convenience later.  YOU SHOULD EXPECT: Some feelings of bloating in the abdomen. Passage of more gas than usual.  Walking can help get rid of the air that was put into your GI tract during the procedure and reduce the bloating. If you had a lower endoscopy (such as a colonoscopy or flexible sigmoidoscopy) you may notice spotting of blood in your stool or on the toilet paper. If you underwent a bowel prep for your procedure, you may not have a normal bowel movement for a few days.  Please Note:  You might notice some irritation and congestion in your nose or some drainage.  This is from the oxygen used during your procedure.  There is no need for concern and it should clear up in a day or so.  SYMPTOMS TO REPORT IMMEDIATELY:  Following lower endoscopy (colonoscopy or flexible sigmoidoscopy):  Excessive amounts of blood in the stool  Significant tenderness or worsening of abdominal pains  Swelling of the abdomen that is new, acute  Fever of 100F or higher  For urgent or emergent issues, a gastroenterologist can be reached at any hour by calling [phone]. Do not use MyChart messaging for urgent concerns.    DIET:  We do recommend a small meal at first, but then you may proceed to your regular diet.  Drink plenty of fluids but you should avoid alcoholic beverages for 24 hours.  ACTIVITY:  You  should plan to take it easy for the rest of today and you should NOT DRIVE or use heavy machinery until tomorrow (because of the sedation medicines used during the test).    FOLLOW UP: Our staff will call the number listed on your records the next business day following your procedure.  We will call around 7:15- 8:00 am to check on you and address any questions or concerns that you may have regarding the information given to you following your procedure. If we do not reach you, we will leave a message.     If any biopsies were taken you will be contacted by phone or by letter within the next 1-3 weeks.  Please call us at [phone] if you have not heard about the biopsies in 3 weeks.    SIGNATURES/CONFIDENTIALITY: You and/or your care partner have signed paperwork which will be entered into your electronic medical record.  These signatures attest to the fact that that the information above on your After Visit Summary has been reviewed and is understood.  Full responsibility of the confidentiality of this discharge information lies with you and/or your care-partner.

## 2023-04-27 NOTE — Progress Notes (Signed)
 Called to room to assist during endoscopic procedure.  Patient ID and intended procedure confirmed with present staff. Received instructions for my participation in the procedure from the performing physician.

## 2023-04-27 NOTE — Progress Notes (Signed)
 GASTROENTEROLOGY PROCEDURE H&P NOTE   Primary Care Physician: Tisovec, Kristina Pfeiffer, MD  HPI: Jamie Figueroa is a 73 y.o. male who presents for Colonoscopy for surveillance.  Past Medical History:  Diagnosis Date   Allergy    Arthritis    left shoulder and knees    Chronic kidney disease    kidney stones   Constipation    occasionally    DM (diabetes mellitus) (HCC)    takes Victoza  daily;Humalog and Levemir  daily   GERD (gastroesophageal reflux disease)    but doesn't take any meds   Headache(784.0)    occasionally to rare    Hemorrhoids    History of gout    HTN (hypertension)    takes Ramipril daily   Hypercholesteremia    Hyperlipidemia    takes Vytorin  daily   IBS (irritable bowel syndrome)    Insomnia    doesn't require meds   Irritable bowel syndrome    Joint pain    Morbid obesity (HCC)    Obstructive sleep apnea (adult) (pediatric)    wears cpap    Osteoarthritis of right shoulder region 02/13/2013   Pain in limb    Peripheral edema    takes Furosemide  daily   Pneumonia    hx of;about 85yrs ago   Psoriasis    Shortness of breath    with exertion   Sleep apnea    Snoring 12/07/2012   Past Surgical History:  Procedure Laterality Date   ANKLE SURGERY Right    COLONOSCOPY  2007,2003   ESOPHAGOGASTRODUODENOSCOPY     KNEE SURGERY Bilateral    TOTAL SHOULDER ARTHROPLASTY Right 02/13/2013   Procedure: TOTAL SHOULDER ARTHROPLASTY;  Surgeon: Neville Barbone, MD;  Location: [facility] OR;  Service: Orthopedics;  Laterality: Right;   TOTAL SHOULDER REPLACEMENT Right 02/13/2013   Dr Agatha Horsfall   Current Outpatient Medications  Medication Sig Dispense Refill   aspirin  81 MG tablet Take 81 mg by mouth daily.     Continuous Blood Gluc Sensor (FREESTYLE LIBRE 2 SENSOR) MISC change every 14 days . to monitor blood glucose     furosemide  (LASIX ) 40 MG tablet Take 40 mg by mouth every other day.     HUMALOG KWIKPEN 100 UNIT/ML KiwkPen INJECT 16 UNITS WITH BIGGEST MEAL  3    INVOKANA 300 MG TABS tablet Take 300 mg by mouth daily.  6   NOVOTWIST 32G X 5 MM MISC Pt uses 5 needles daily     ramipril (ALTACE) 10 MG capsule Take 10 mg by mouth daily.      TRESIBA FLEXTOUCH 200 UNIT/ML FlexTouch Pen SMARTSIG:60 Unit(s) SUB-Q Daily     benzonatate (TESSALON) 100 MG capsule 1 capsule as needed Orally Three times a day for 10 days     Choline Fenofibrate  (FENOFIBRIC ACID ) 135 MG CPDR Take 1 tablet by mouth daily.      fluticasone (FLONASE) 50 MCG/ACT nasal spray 1 spray daily as needed.     gabapentin (NEURONTIN) 100 MG capsule TAKE ONE CAPSULE BY MOUTH 1 TIME A DAY AS NEEDED  0   Melatonin 10 MG TABS Take 1 tablet by mouth daily.     omeprazole (PRILOSEC) 20 MG capsule Take 20 mg by mouth daily.     ondansetron  (ZOFRAN -ODT) 8 MG disintegrating tablet 1 tablet on the tongue and allow to dissolve as needed Orally three times daily as needed for nausea and prior to tamiflu for 5 days     oxyCODONE -acetaminophen  (PERCOCET/ROXICET) 5-325 MG  tablet Take 1 tablet by mouth every 6 (six) hours as needed for severe pain. (Patient not taking: Reported on 04/27/2023) 10 tablet 0   Semaglutide , 1 MG/DOSE, (OZEMPIC , 1 MG/DOSE,) 4 MG/3ML SOPN Inject 1 mg into the skin once a week. 9 mL 3   triamcinolone  cream (KENALOG) 0.1 % as needed.     VYTORIN  10-40 MG per tablet Take 1 tablet by mouth daily.      Current Facility-Administered Medications  Medication Dose Route Frequency Provider Last Rate Last Admin   0.9 %  sodium chloride  infusion  500 mL Intravenous Once Mansouraty, Kamerin Axford Jr., MD        Current Outpatient Medications:    aspirin  81 MG tablet, Take 81 mg by mouth daily., Disp: , Rfl:    Continuous Blood Gluc Sensor (FREESTYLE LIBRE 2 SENSOR) MISC, change every 14 days . to monitor blood glucose, Disp: , Rfl:    furosemide  (LASIX ) 40 MG tablet, Take 40 mg by mouth every other day., Disp: , Rfl:    HUMALOG KWIKPEN 100 UNIT/ML KiwkPen, INJECT 16 UNITS WITH BIGGEST MEAL, Disp: ,  Rfl: 3   INVOKANA 300 MG TABS tablet, Take 300 mg by mouth daily., Disp: , Rfl: 6   NOVOTWIST 32G X 5 MM MISC, Pt uses 5 needles daily, Disp: , Rfl:    ramipril (ALTACE) 10 MG capsule, Take 10 mg by mouth daily. , Disp: , Rfl:    TRESIBA FLEXTOUCH 200 UNIT/ML FlexTouch Pen, SMARTSIG:60 Unit(s) SUB-Q Daily, Disp: , Rfl:    benzonatate (TESSALON) 100 MG capsule, 1 capsule as needed Orally Three times a day for 10 days, Disp: , Rfl:    Choline Fenofibrate  (FENOFIBRIC ACID ) 135 MG CPDR, Take 1 tablet by mouth daily. , Disp: , Rfl:    fluticasone (FLONASE) 50 MCG/ACT nasal spray, 1 spray daily as needed., Disp: , Rfl:    gabapentin (NEURONTIN) 100 MG capsule, TAKE ONE CAPSULE BY MOUTH 1 TIME A DAY AS NEEDED, Disp: , Rfl: 0   Melatonin 10 MG TABS, Take 1 tablet by mouth daily., Disp: , Rfl:    omeprazole (PRILOSEC) 20 MG capsule, Take 20 mg by mouth daily., Disp: , Rfl:    ondansetron  (ZOFRAN -ODT) 8 MG disintegrating tablet, 1 tablet on the tongue and allow to dissolve as needed Orally three times daily as needed for nausea and prior to tamiflu for 5 days, Disp: , Rfl:    oxyCODONE -acetaminophen  (PERCOCET/ROXICET) 5-325 MG tablet, Take 1 tablet by mouth every 6 (six) hours as needed for severe pain. (Patient not taking: Reported on 04/27/2023), Disp: 10 tablet, Rfl: 0   Semaglutide , 1 MG/DOSE, (OZEMPIC , 1 MG/DOSE,) 4 MG/3ML SOPN, Inject 1 mg into the skin once a week., Disp: 9 mL, Rfl: 3   triamcinolone  cream (KENALOG) 0.1 %, as needed., Disp: , Rfl:    VYTORIN  10-40 MG per tablet, Take 1 tablet by mouth daily. , Disp: , Rfl:   Current Facility-Administered Medications:    0.9 %  sodium chloride  infusion, 500 mL, Intravenous, Once, Mansouraty, Albino Alu., MD Allergies  Allergen Reactions   Sulfamethoxazole-Trimethoprim Nausea Only   Family History  Problem Relation Age of Onset   Diabetes Mother    Colon cancer Neg Hx    Colon polyps Neg Hx    Esophageal cancer Neg Hx    Rectal cancer Neg Hx     Stomach cancer Neg Hx    Sleep apnea Neg Hx    Social History   Socioeconomic History  Marital status: Married    Spouse name: Not on file   Number of children: Not on file   Years of education: Not on file   Highest education level: Not on file  Occupational History   Not on file  Tobacco Use   Smoking status: Never   Smokeless tobacco: Never  Vaping Use   Vaping status: Never Used  Substance and Sexual Activity   Alcohol  use: Yes    Alcohol /week: 0.0 standard drinks of alcohol     Comment: occasionally beer   Drug use: No   Sexual activity: Yes  Other Topics Concern   Not on file  Social History Narrative   Not on file   Social Drivers of Health   Financial Resource Strain: Not on file  Food Insecurity: Not on file  Transportation Needs: Not on file  Physical Activity: Not on file  Stress: Not on file  Social Connections: Not on file  Intimate Partner Violence: Not on file    Physical Exam: Today's Vitals   04/27/23 0720 04/27/23 0724  BP: (!) 124/58   Pulse: 82   Temp: (!) 97.3 F (36.3 C) (!) 97.3 F (36.3 C)  TempSrc: Temporal   SpO2: 94%   Weight: 250 lb (113.4 kg)    Body mass index is 36.39 kg/m. GEN: NAD EYE: Sclerae anicteric ENT: MMM CV: Non-tachycardic GI: Soft, NT/ND NEURO:  Alert & Oriented x 3  Lab Results: No results for input(s): "WBC", "HGB", "HCT", "PLT" in the last 72 hours. BMET No results for input(s): "NA", "K", "CL", "CO2", "GLUCOSE", "BUN", "CREATININE", "CALCIUM" in the last 72 hours. LFT No results for input(s): "PROT", "ALBUMIN", "AST", "ALT", "ALKPHOS", "BILITOT", "BILIDIR", "IBILI" in the last 72 hours. PT/INR No results for input(s): "LABPROT", "INR" in the last 72 hours.   Impression / Plan: This is a 73 y.o.male who presents for Colonoscopy for surveillance.  The risks and benefits of endoscopic evaluation/treatment were discussed with the patient and/or family; these include but are not limited to the risk  of perforation, infection, bleeding, missed lesions, lack of diagnosis, severe illness requiring hospitalization, as well as anesthesia and sedation related illnesses.  The patient's history has been reviewed, patient examined, no change in status, and deemed stable for procedure.  The patient and/or family is agreeable to proceed.    Yong Henle, MD [facility] Office # [phone]

## 2023-04-28 ENCOUNTER — Telehealth: Payer: Self-pay | Admitting: *Deleted

## 2023-04-28 NOTE — Telephone Encounter (Signed)
  Follow up Call-     04/27/2023    7:24 AM  Call back number  Post procedure Call Back phone  # [phone]  Permission to leave phone message Yes     Patient questions:   Message left to call us if necessary.

## 2023-04-29 ENCOUNTER — Encounter: Payer: Self-pay | Admitting: Gastroenterology

## 2023-04-29 LAB — SURGICAL PATHOLOGY

## 2023-05-01 DIAGNOSIS — E1065 Type 1 diabetes mellitus with hyperglycemia: Secondary | ICD-10-CM | POA: Diagnosis not present

## 2023-05-03 ENCOUNTER — Other Ambulatory Visit (HOSPITAL_COMMUNITY): Payer: Self-pay

## 2023-05-18 ENCOUNTER — Other Ambulatory Visit: Payer: Self-pay

## 2023-05-18 ENCOUNTER — Other Ambulatory Visit (HOSPITAL_COMMUNITY): Payer: Self-pay

## 2023-05-20 ENCOUNTER — Other Ambulatory Visit: Payer: Self-pay

## 2023-05-20 ENCOUNTER — Other Ambulatory Visit (HOSPITAL_COMMUNITY): Payer: Self-pay

## 2023-05-23 DIAGNOSIS — M25511 Pain in right shoulder: Secondary | ICD-10-CM | POA: Diagnosis not present

## 2023-05-23 DIAGNOSIS — M19012 Primary osteoarthritis, left shoulder: Secondary | ICD-10-CM | POA: Diagnosis not present

## 2023-05-23 DIAGNOSIS — M170 Bilateral primary osteoarthritis of knee: Secondary | ICD-10-CM | POA: Diagnosis not present

## 2023-05-31 DIAGNOSIS — M1712 Unilateral primary osteoarthritis, left knee: Secondary | ICD-10-CM | POA: Diagnosis not present

## 2023-05-31 DIAGNOSIS — M25562 Pain in left knee: Secondary | ICD-10-CM | POA: Diagnosis not present

## 2023-06-01 DIAGNOSIS — E1065 Type 1 diabetes mellitus with hyperglycemia: Secondary | ICD-10-CM | POA: Diagnosis not present

## 2023-06-08 DIAGNOSIS — E7800 Pure hypercholesterolemia, unspecified: Secondary | ICD-10-CM | POA: Diagnosis not present

## 2023-06-08 DIAGNOSIS — G4733 Obstructive sleep apnea (adult) (pediatric): Secondary | ICD-10-CM | POA: Diagnosis not present

## 2023-06-08 DIAGNOSIS — E1129 Type 2 diabetes mellitus with other diabetic kidney complication: Secondary | ICD-10-CM | POA: Diagnosis not present

## 2023-06-08 DIAGNOSIS — Z794 Long term (current) use of insulin: Secondary | ICD-10-CM | POA: Diagnosis not present

## 2023-06-08 DIAGNOSIS — I129 Hypertensive chronic kidney disease with stage 1 through stage 4 chronic kidney disease, or unspecified chronic kidney disease: Secondary | ICD-10-CM | POA: Diagnosis not present

## 2023-06-08 DIAGNOSIS — N1831 Chronic kidney disease, stage 3a: Secondary | ICD-10-CM | POA: Diagnosis not present

## 2023-06-08 NOTE — Progress Notes (Unsigned)
 Setup Date 05/23/2018

## 2023-06-09 ENCOUNTER — Encounter: Payer: Self-pay | Admitting: Adult Health

## 2023-06-09 ENCOUNTER — Ambulatory Visit: Payer: Medicare PPO | Admitting: Adult Health

## 2023-06-09 VITALS — BP 134/81 | HR 60 | Ht 70.0 in | Wt 246.0 lb

## 2023-06-09 DIAGNOSIS — G4733 Obstructive sleep apnea (adult) (pediatric): Secondary | ICD-10-CM

## 2023-06-09 NOTE — Patient Instructions (Signed)
 Continue using CPAP nightly and greater than 4 hours each night If your symptoms worsen or you develop new symptoms please let us know.

## 2023-06-09 NOTE — Progress Notes (Signed)
 PATIENT: Jamie Figueroa DOB: 04-Feb-1951  REASON FOR VISIT: follow up HISTORY FROM: patient Primary neurologist: Dr. Frances Furbish  Chief Complaint  Patient presents with   RM 4    Patient is here with his wife for yearly cpap follow-up. He denies any trouble with his cpap. He states sometimes it makes him feel better and sometimes it doesn't but it is "better than what it used to be". He is getting ready to have knee surgery. ESS 9 today.     HISTORY OF PRESENT ILLNESS: Today 06/09/23:  Jamie Figueroa is a 73 y.o. male with a history of OSA on CPAP. Returns today for follow-up.  He reports that the CPAP is working well for him.  He states that some nights he falls asleep without putting it on.  He is planning to have knee surgery next month.  His download is below     06/10/22:Jamie Figueroa is a 73 y.o. male with a history of obstructive sleep apnea on CPAP. Returns today for follow-up.  Reports that the CPAP is working well for him.  At the last visit we adjusted his pressure 7-16.  He is tolerating this well.  Residual AHI has decreased.  Download is below.       11/24/2021: Mr. Jamie Figueroa is a 73 year old male with a history of obstructive sleep apnea on CPAP.  He returns today for follow-up.  His download is below.  He states for the last 30 days he has not used it much because he was on vacation.  He states that he did take his machine with him but never set it up.  He also states that there are some nights he tends to fall asleep before putting it on.  He still struggles to use the CPAP.  Reports that he wakes up often at night.  Wife reports that he sleeps during the day and this is the cause of him not sleeping well at night.  Patient disagrees.    11/24/20: Mr. Jamie Figueroa is a 73 year old male with a history of obstructive sleep apnea on CPAP.  He returns today for follow-up.  He states the first week of August he was on vacation and did not use his machine.  He states the nights he does  have restless nights.  He does not feel the mask leaking most nights.  His download is below    11/21/19: Mr. Jamie Figueroa  is a 73 year old male with a history of obstructive sleep apnea on CPAP.  He returns today for follow-up.  His download indicates that he use his machine 13 out of 30 days for compliance of 43%.  He uses machine greater than 4 hours each night.  On average he uses it 4 hours and 57 minutes.  His residual AHI is 3.1 on 11 cm of water with EPR of 3.  He reports that there are some nights that he forgets to put his machine on and falls asleep.  He also states that they went on vacation for a week and he did not take it with him.  HISTORY 05/24/19:   Mr. Jamie Figueroa is a 73 year old male with a history of obstructive sleep apnea on CPAP.  He returns today for follow-up.  His download indicates that he uses machine 20 out of 30 days for compliance of 67%.  He uses machine greater than 4 hours 17 days for compliance of 57%.  On average he uses his machine 5 hours and 2 minutes.  His  residual AHI is 2.5 on 11 cm of water with EPR 3.  His leak in the 95th percentile is 34 L/min.  He reports that the last visit he never got a call from his DME company to have a mask refitting.  He states that most nights he is unable to use the machine continuously due to the leak.  He returns today for an evaluation.  REVIEW OF SYSTEMS: Out of a complete 14 system review of symptoms, the patient complains only of the following symptoms, and all other reviewed systems are negative.  ESS 9   ALLERGIES: Allergies  Allergen Reactions   Sulfamethoxazole-Trimethoprim Nausea Only    HOME MEDICATIONS: Outpatient Medications Prior to Visit  Medication Sig Dispense Refill   aspirin 81 MG tablet Take 81 mg by mouth daily.     benzonatate (TESSALON) 100 MG capsule 1 capsule as needed Orally Three times a day for 10 days     Choline Fenofibrate (FENOFIBRIC ACID) 135 MG CPDR Take 1 tablet by mouth daily.      Continuous  Blood Gluc Sensor (FREESTYLE LIBRE 2 SENSOR) MISC change every 14 days . to monitor blood glucose     fluticasone (FLONASE) 50 MCG/ACT nasal spray 1 spray daily as needed.     furosemide (LASIX) 40 MG tablet Take 40 mg by mouth every other day.     gabapentin (NEURONTIN) 100 MG capsule TAKE ONE CAPSULE BY MOUTH 1 TIME A DAY AS NEEDED  0   HUMALOG KWIKPEN 100 UNIT/ML KiwkPen INJECT 16 UNITS WITH BIGGEST MEAL  3   INVOKANA 300 MG TABS tablet Take 300 mg by mouth daily.  6   Melatonin 10 MG TABS Take 1 tablet by mouth daily.     NOVOTWIST 32G X 5 MM MISC Pt uses 5 needles daily     omeprazole (PRILOSEC) 20 MG capsule Take 20 mg by mouth daily.     ondansetron (ZOFRAN-ODT) 8 MG disintegrating tablet 1 tablet on the tongue and allow to dissolve as needed Orally three times daily as needed for nausea and prior to tamiflu for 5 days     oxyCODONE-acetaminophen (PERCOCET/ROXICET) 5-325 MG tablet Take 1 tablet by mouth every 6 (six) hours as needed for severe pain. (Patient not taking: Reported on 04/27/2023) 10 tablet 0   ramipril (ALTACE) 10 MG capsule Take 10 mg by mouth daily.      Semaglutide, 1 MG/DOSE, (OZEMPIC, 1 MG/DOSE,) 4 MG/3ML SOPN Inject 1 mg into the skin once a week. 9 mL 3   TRESIBA FLEXTOUCH 200 UNIT/ML FlexTouch Pen SMARTSIG:60 Unit(s) SUB-Q Daily     triamcinolone cream (KENALOG) 0.1 % as needed.     VYTORIN 10-40 MG per tablet Take 1 tablet by mouth daily.      No facility-administered medications prior to visit.    PAST MEDICAL HISTORY: Past Medical History:  Diagnosis Date   Allergy    Arthritis    left shoulder and knees    Chronic kidney disease    kidney stones   Constipation    occasionally    DM (diabetes mellitus) (HCC)    takes Victoza daily;Humalog and Levemir daily   GERD (gastroesophageal reflux disease)    but doesn't take any meds   Headache(784.0)    occasionally to rare    Hemorrhoids    History of gout    HTN (hypertension)    takes Ramipril daily    Hypercholesteremia    Hyperlipidemia    takes Vytorin daily  IBS (irritable bowel syndrome)    Insomnia    doesn't require meds   Irritable bowel syndrome    Joint pain    Morbid obesity (HCC)    Obstructive sleep apnea (adult) (pediatric)    wears cpap    Osteoarthritis of right shoulder region 02/13/2013   Pain in limb    Peripheral edema    takes Furosemide daily   Pneumonia    hx of;about 66yrs ago   Psoriasis    Shortness of breath    with exertion   Sleep apnea    Snoring 12/07/2012    PAST SURGICAL HISTORY: Past Surgical History:  Procedure Laterality Date   ANKLE SURGERY Right    COLONOSCOPY  2007,2003   ESOPHAGOGASTRODUODENOSCOPY     KNEE SURGERY Bilateral    TOTAL SHOULDER ARTHROPLASTY Right 02/13/2013   Procedure: TOTAL SHOULDER ARTHROPLASTY;  Surgeon: Eulas Post, MD;  Location: [facility] OR;  Service: Orthopedics;  Laterality: Right;   TOTAL SHOULDER REPLACEMENT Right 02/13/2013   Dr Dion Saucier    FAMILY HISTORY: Family History  Problem Relation Age of Onset   Diabetes Mother    Colon cancer Neg Hx    Colon polyps Neg Hx    Esophageal cancer Neg Hx    Rectal cancer Neg Hx    Stomach cancer Neg Hx    Sleep apnea Neg Hx     SOCIAL HISTORY: Social History   Socioeconomic History   Marital status: Married    Spouse name: Not on file   Number of children: Not on file   Years of education: Not on file   Highest education level: Not on file  Occupational History   Not on file  Tobacco Use   Smoking status: Never   Smokeless tobacco: Never  Vaping Use   Vaping status: Never Used  Substance and Sexual Activity   Alcohol use: Yes    Alcohol/week: 0.0 standard drinks of alcohol    Comment: occasionally beer   Drug use: No   Sexual activity: Yes  Other Topics Concern   Not on file  Social History Narrative   Not on file   Social Drivers of Health   Financial Resource Strain: Not on file  Food Insecurity: Not on file  Transportation Needs: Not  on file  Physical Activity: Not on file  Stress: Not on file  Social Connections: Not on file  Intimate Partner Violence: Not on file      PHYSICAL EXAM  Vitals:   06/09/23 1113  BP: 134/81  Pulse: 60  Weight: 246 lb (111.6 kg)  Height: 5\' 10"  (1.778 m)   Body mass index is 35.3 kg/m.  Generalized: Well developed, in no acute distress  Chest: Lungs clear to auscultation bilaterally  Neurological examination  Mentation: Alert oriented to time, place, history taking. Follows all commands speech and language fluent Cranial nerve II-XII: Extraocular movements were full, visual field were full on confrontational test Head turning and shoulder shrug  were normal and symmetric.   Gait and station: Gait is normal.    DIAGNOSTIC DATA (LABS, IMAGING, TESTING) - I reviewed patient records, labs, notes, testing and imaging myself where available.  Lab Results  Component Value Date   WBC 13.1 (H) 02/14/2013   HGB 12.6 (L) 02/14/2013   HCT 37.5 (L) 02/14/2013   MCV 96.9 02/14/2013   PLT 187 02/14/2013      Component Value Date/Time   NA 136 02/14/2013 0534   K 4.6 02/14/2013 0534  CL 104 02/14/2013 0534   CO2 23 02/14/2013 0534   GLUCOSE 231 (H) 02/14/2013 0534   BUN 21 02/14/2013 0534   CREATININE 1.25 02/14/2013 0534   CALCIUM 8.4 02/14/2013 0534   GFRNONAA 60 (L) 02/14/2013 0534   GFRAA 69 (L) 02/14/2013 0534      ASSESSMENT AND PLAN 73 y.o. year old male  has a past medical history of Allergy, Arthritis, Chronic kidney disease, Constipation, DM (diabetes mellitus) (HCC), GERD (gastroesophageal reflux disease), Headache(784.0), Hemorrhoids, History of gout, HTN (hypertension), Hypercholesteremia, Hyperlipidemia, IBS (irritable bowel syndrome), Insomnia, Irritable bowel syndrome, Joint pain, Morbid obesity (HCC), Obstructive sleep apnea (adult) (pediatric), Osteoarthritis of right shoulder region (02/13/2013), Pain in limb, Peripheral edema, Pneumonia, Psoriasis,  Shortness of breath, Sleep apnea, and Snoring (12/07/2012). here with:  OSA on CPAP  - CPAP compliance is suboptimal -Residual AHI in normal range - Encourage patient to use CPAP nightly and > 4 hours each night - F/U in 6 months or sooner if needed   Butch Penny, MSN, NP-C 06/09/2023, 5:14 AM [facility] [address] [place], [place] [address] [phone]

## 2023-06-23 DIAGNOSIS — G8918 Other acute postprocedural pain: Secondary | ICD-10-CM | POA: Diagnosis not present

## 2023-06-23 DIAGNOSIS — M1712 Unilateral primary osteoarthritis, left knee: Secondary | ICD-10-CM | POA: Diagnosis not present

## 2023-06-27 DIAGNOSIS — M1712 Unilateral primary osteoarthritis, left knee: Secondary | ICD-10-CM | POA: Diagnosis not present

## 2023-06-27 DIAGNOSIS — M6281 Muscle weakness (generalized): Secondary | ICD-10-CM | POA: Diagnosis not present

## 2023-06-27 DIAGNOSIS — R262 Difficulty in walking, not elsewhere classified: Secondary | ICD-10-CM | POA: Diagnosis not present

## 2023-06-27 DIAGNOSIS — M25662 Stiffness of left knee, not elsewhere classified: Secondary | ICD-10-CM | POA: Diagnosis not present

## 2023-06-29 DIAGNOSIS — E1065 Type 1 diabetes mellitus with hyperglycemia: Secondary | ICD-10-CM | POA: Diagnosis not present

## 2023-06-29 DIAGNOSIS — M6281 Muscle weakness (generalized): Secondary | ICD-10-CM | POA: Diagnosis not present

## 2023-06-29 DIAGNOSIS — M1712 Unilateral primary osteoarthritis, left knee: Secondary | ICD-10-CM | POA: Diagnosis not present

## 2023-06-29 DIAGNOSIS — M25662 Stiffness of left knee, not elsewhere classified: Secondary | ICD-10-CM | POA: Diagnosis not present

## 2023-06-29 DIAGNOSIS — R262 Difficulty in walking, not elsewhere classified: Secondary | ICD-10-CM | POA: Diagnosis not present

## 2023-06-30 DIAGNOSIS — E1065 Type 1 diabetes mellitus with hyperglycemia: Secondary | ICD-10-CM | POA: Diagnosis not present

## 2023-07-06 DIAGNOSIS — M6281 Muscle weakness (generalized): Secondary | ICD-10-CM | POA: Diagnosis not present

## 2023-07-06 DIAGNOSIS — M25662 Stiffness of left knee, not elsewhere classified: Secondary | ICD-10-CM | POA: Diagnosis not present

## 2023-07-06 DIAGNOSIS — R262 Difficulty in walking, not elsewhere classified: Secondary | ICD-10-CM | POA: Diagnosis not present

## 2023-07-06 DIAGNOSIS — M1712 Unilateral primary osteoarthritis, left knee: Secondary | ICD-10-CM | POA: Diagnosis not present

## 2023-07-07 ENCOUNTER — Other Ambulatory Visit (HOSPITAL_COMMUNITY): Payer: Self-pay

## 2023-07-07 ENCOUNTER — Ambulatory Visit (HOSPITAL_COMMUNITY)
Admission: RE | Admit: 2023-07-07 | Discharge: 2023-07-07 | Disposition: A | Discharge status: Home / Self Care | Source: Ambulatory Visit | Attending: Vascular Surgery | Admitting: Vascular Surgery

## 2023-07-07 ENCOUNTER — Other Ambulatory Visit (HOSPITAL_COMMUNITY): Payer: Self-pay | Admitting: Surgical

## 2023-07-07 DIAGNOSIS — M7989 Other specified soft tissue disorders: Secondary | ICD-10-CM | POA: Diagnosis not present

## 2023-07-07 DIAGNOSIS — R238 Other skin changes: Secondary | ICD-10-CM | POA: Diagnosis not present

## 2023-07-07 DIAGNOSIS — M79669 Pain in unspecified lower leg: Secondary | ICD-10-CM | POA: Diagnosis not present

## 2023-07-07 DIAGNOSIS — R600 Localized edema: Secondary | ICD-10-CM | POA: Diagnosis not present

## 2023-07-07 DIAGNOSIS — I129 Hypertensive chronic kidney disease with stage 1 through stage 4 chronic kidney disease, or unspecified chronic kidney disease: Secondary | ICD-10-CM | POA: Diagnosis not present

## 2023-07-07 DIAGNOSIS — I872 Venous insufficiency (chronic) (peripheral): Secondary | ICD-10-CM | POA: Diagnosis not present

## 2023-07-07 DIAGNOSIS — M1712 Unilateral primary osteoarthritis, left knee: Secondary | ICD-10-CM | POA: Diagnosis not present

## 2023-07-07 DIAGNOSIS — Z96651 Presence of right artificial knee joint: Secondary | ICD-10-CM | POA: Diagnosis not present

## 2023-07-07 DIAGNOSIS — R0609 Other forms of dyspnea: Secondary | ICD-10-CM | POA: Diagnosis not present

## 2023-07-11 DIAGNOSIS — R0609 Other forms of dyspnea: Secondary | ICD-10-CM | POA: Diagnosis not present

## 2023-07-11 DIAGNOSIS — I872 Venous insufficiency (chronic) (peripheral): Secondary | ICD-10-CM | POA: Diagnosis not present

## 2023-07-11 DIAGNOSIS — L03116 Cellulitis of left lower limb: Secondary | ICD-10-CM | POA: Diagnosis not present

## 2023-07-11 DIAGNOSIS — R600 Localized edema: Secondary | ICD-10-CM | POA: Diagnosis not present

## 2023-07-11 DIAGNOSIS — R238 Other skin changes: Secondary | ICD-10-CM | POA: Diagnosis not present

## 2023-07-11 DIAGNOSIS — I129 Hypertensive chronic kidney disease with stage 1 through stage 4 chronic kidney disease, or unspecified chronic kidney disease: Secondary | ICD-10-CM | POA: Diagnosis not present

## 2023-07-11 DIAGNOSIS — N1831 Chronic kidney disease, stage 3a: Secondary | ICD-10-CM | POA: Diagnosis not present

## 2023-07-11 DIAGNOSIS — M1712 Unilateral primary osteoarthritis, left knee: Secondary | ICD-10-CM | POA: Diagnosis not present

## 2023-07-11 DIAGNOSIS — Z96651 Presence of right artificial knee joint: Secondary | ICD-10-CM | POA: Diagnosis not present

## 2023-07-12 DIAGNOSIS — M6281 Muscle weakness (generalized): Secondary | ICD-10-CM | POA: Diagnosis not present

## 2023-07-12 DIAGNOSIS — R262 Difficulty in walking, not elsewhere classified: Secondary | ICD-10-CM | POA: Diagnosis not present

## 2023-07-12 DIAGNOSIS — M25662 Stiffness of left knee, not elsewhere classified: Secondary | ICD-10-CM | POA: Diagnosis not present

## 2023-07-12 DIAGNOSIS — M1712 Unilateral primary osteoarthritis, left knee: Secondary | ICD-10-CM | POA: Diagnosis not present

## 2023-07-14 DIAGNOSIS — M1712 Unilateral primary osteoarthritis, left knee: Secondary | ICD-10-CM | POA: Diagnosis not present

## 2023-07-14 DIAGNOSIS — M25662 Stiffness of left knee, not elsewhere classified: Secondary | ICD-10-CM | POA: Diagnosis not present

## 2023-07-14 DIAGNOSIS — M6281 Muscle weakness (generalized): Secondary | ICD-10-CM | POA: Diagnosis not present

## 2023-07-14 DIAGNOSIS — R262 Difficulty in walking, not elsewhere classified: Secondary | ICD-10-CM | POA: Diagnosis not present

## 2023-07-18 DIAGNOSIS — M25662 Stiffness of left knee, not elsewhere classified: Secondary | ICD-10-CM | POA: Diagnosis not present

## 2023-07-18 DIAGNOSIS — R262 Difficulty in walking, not elsewhere classified: Secondary | ICD-10-CM | POA: Diagnosis not present

## 2023-07-18 DIAGNOSIS — M1712 Unilateral primary osteoarthritis, left knee: Secondary | ICD-10-CM | POA: Diagnosis not present

## 2023-07-18 DIAGNOSIS — M6281 Muscle weakness (generalized): Secondary | ICD-10-CM | POA: Diagnosis not present

## 2023-07-21 DIAGNOSIS — M6281 Muscle weakness (generalized): Secondary | ICD-10-CM | POA: Diagnosis not present

## 2023-07-21 DIAGNOSIS — R262 Difficulty in walking, not elsewhere classified: Secondary | ICD-10-CM | POA: Diagnosis not present

## 2023-07-21 DIAGNOSIS — M25662 Stiffness of left knee, not elsewhere classified: Secondary | ICD-10-CM | POA: Diagnosis not present

## 2023-07-21 DIAGNOSIS — M1712 Unilateral primary osteoarthritis, left knee: Secondary | ICD-10-CM | POA: Diagnosis not present

## 2023-07-27 DIAGNOSIS — M25662 Stiffness of left knee, not elsewhere classified: Secondary | ICD-10-CM | POA: Diagnosis not present

## 2023-07-27 DIAGNOSIS — M6281 Muscle weakness (generalized): Secondary | ICD-10-CM | POA: Diagnosis not present

## 2023-07-27 DIAGNOSIS — R262 Difficulty in walking, not elsewhere classified: Secondary | ICD-10-CM | POA: Diagnosis not present

## 2023-07-27 DIAGNOSIS — M1712 Unilateral primary osteoarthritis, left knee: Secondary | ICD-10-CM | POA: Diagnosis not present

## 2023-07-30 DIAGNOSIS — E1065 Type 1 diabetes mellitus with hyperglycemia: Secondary | ICD-10-CM | POA: Diagnosis not present

## 2023-08-01 DIAGNOSIS — M25662 Stiffness of left knee, not elsewhere classified: Secondary | ICD-10-CM | POA: Diagnosis not present

## 2023-08-01 DIAGNOSIS — R262 Difficulty in walking, not elsewhere classified: Secondary | ICD-10-CM | POA: Diagnosis not present

## 2023-08-01 DIAGNOSIS — M6281 Muscle weakness (generalized): Secondary | ICD-10-CM | POA: Diagnosis not present

## 2023-08-01 DIAGNOSIS — M1712 Unilateral primary osteoarthritis, left knee: Secondary | ICD-10-CM | POA: Diagnosis not present

## 2023-08-03 DIAGNOSIS — M25662 Stiffness of left knee, not elsewhere classified: Secondary | ICD-10-CM | POA: Diagnosis not present

## 2023-08-03 DIAGNOSIS — M6281 Muscle weakness (generalized): Secondary | ICD-10-CM | POA: Diagnosis not present

## 2023-08-03 DIAGNOSIS — R262 Difficulty in walking, not elsewhere classified: Secondary | ICD-10-CM | POA: Diagnosis not present

## 2023-08-03 DIAGNOSIS — M1712 Unilateral primary osteoarthritis, left knee: Secondary | ICD-10-CM | POA: Diagnosis not present

## 2023-08-08 DIAGNOSIS — M6281 Muscle weakness (generalized): Secondary | ICD-10-CM | POA: Diagnosis not present

## 2023-08-08 DIAGNOSIS — M25662 Stiffness of left knee, not elsewhere classified: Secondary | ICD-10-CM | POA: Diagnosis not present

## 2023-08-08 DIAGNOSIS — R262 Difficulty in walking, not elsewhere classified: Secondary | ICD-10-CM | POA: Diagnosis not present

## 2023-08-08 DIAGNOSIS — M1712 Unilateral primary osteoarthritis, left knee: Secondary | ICD-10-CM | POA: Diagnosis not present

## 2023-08-10 DIAGNOSIS — R262 Difficulty in walking, not elsewhere classified: Secondary | ICD-10-CM | POA: Diagnosis not present

## 2023-08-10 DIAGNOSIS — M1712 Unilateral primary osteoarthritis, left knee: Secondary | ICD-10-CM | POA: Diagnosis not present

## 2023-08-10 DIAGNOSIS — M6281 Muscle weakness (generalized): Secondary | ICD-10-CM | POA: Diagnosis not present

## 2023-08-10 DIAGNOSIS — M25662 Stiffness of left knee, not elsewhere classified: Secondary | ICD-10-CM | POA: Diagnosis not present

## 2023-08-15 DIAGNOSIS — M25662 Stiffness of left knee, not elsewhere classified: Secondary | ICD-10-CM | POA: Diagnosis not present

## 2023-08-15 DIAGNOSIS — R262 Difficulty in walking, not elsewhere classified: Secondary | ICD-10-CM | POA: Diagnosis not present

## 2023-08-15 DIAGNOSIS — M1712 Unilateral primary osteoarthritis, left knee: Secondary | ICD-10-CM | POA: Diagnosis not present

## 2023-08-15 DIAGNOSIS — M6281 Muscle weakness (generalized): Secondary | ICD-10-CM | POA: Diagnosis not present

## 2023-08-17 DIAGNOSIS — M1712 Unilateral primary osteoarthritis, left knee: Secondary | ICD-10-CM | POA: Diagnosis not present

## 2023-08-17 DIAGNOSIS — R262 Difficulty in walking, not elsewhere classified: Secondary | ICD-10-CM | POA: Diagnosis not present

## 2023-08-17 DIAGNOSIS — M25662 Stiffness of left knee, not elsewhere classified: Secondary | ICD-10-CM | POA: Diagnosis not present

## 2023-08-17 DIAGNOSIS — M6281 Muscle weakness (generalized): Secondary | ICD-10-CM | POA: Diagnosis not present

## 2023-08-22 DIAGNOSIS — M1712 Unilateral primary osteoarthritis, left knee: Secondary | ICD-10-CM | POA: Diagnosis not present

## 2023-08-22 DIAGNOSIS — M25662 Stiffness of left knee, not elsewhere classified: Secondary | ICD-10-CM | POA: Diagnosis not present

## 2023-08-22 DIAGNOSIS — R262 Difficulty in walking, not elsewhere classified: Secondary | ICD-10-CM | POA: Diagnosis not present

## 2023-08-22 DIAGNOSIS — M6281 Muscle weakness (generalized): Secondary | ICD-10-CM | POA: Diagnosis not present

## 2023-08-24 DIAGNOSIS — M1712 Unilateral primary osteoarthritis, left knee: Secondary | ICD-10-CM | POA: Diagnosis not present

## 2023-08-24 DIAGNOSIS — M25662 Stiffness of left knee, not elsewhere classified: Secondary | ICD-10-CM | POA: Diagnosis not present

## 2023-08-24 DIAGNOSIS — R262 Difficulty in walking, not elsewhere classified: Secondary | ICD-10-CM | POA: Diagnosis not present

## 2023-08-24 DIAGNOSIS — M6281 Muscle weakness (generalized): Secondary | ICD-10-CM | POA: Diagnosis not present

## 2023-08-29 DIAGNOSIS — R262 Difficulty in walking, not elsewhere classified: Secondary | ICD-10-CM | POA: Diagnosis not present

## 2023-08-29 DIAGNOSIS — M1712 Unilateral primary osteoarthritis, left knee: Secondary | ICD-10-CM | POA: Diagnosis not present

## 2023-08-29 DIAGNOSIS — M6281 Muscle weakness (generalized): Secondary | ICD-10-CM | POA: Diagnosis not present

## 2023-08-29 DIAGNOSIS — M25662 Stiffness of left knee, not elsewhere classified: Secondary | ICD-10-CM | POA: Diagnosis not present

## 2023-08-29 DIAGNOSIS — E1065 Type 1 diabetes mellitus with hyperglycemia: Secondary | ICD-10-CM | POA: Diagnosis not present

## 2023-08-31 DIAGNOSIS — M25662 Stiffness of left knee, not elsewhere classified: Secondary | ICD-10-CM | POA: Diagnosis not present

## 2023-08-31 DIAGNOSIS — M6281 Muscle weakness (generalized): Secondary | ICD-10-CM | POA: Diagnosis not present

## 2023-08-31 DIAGNOSIS — R262 Difficulty in walking, not elsewhere classified: Secondary | ICD-10-CM | POA: Diagnosis not present

## 2023-08-31 DIAGNOSIS — M1712 Unilateral primary osteoarthritis, left knee: Secondary | ICD-10-CM | POA: Diagnosis not present

## 2023-09-09 DIAGNOSIS — H1132 Conjunctival hemorrhage, left eye: Secondary | ICD-10-CM | POA: Diagnosis not present

## 2023-09-21 DIAGNOSIS — E1129 Type 2 diabetes mellitus with other diabetic kidney complication: Secondary | ICD-10-CM | POA: Diagnosis not present

## 2023-09-21 DIAGNOSIS — N1831 Chronic kidney disease, stage 3a: Secondary | ICD-10-CM | POA: Diagnosis not present

## 2023-09-21 DIAGNOSIS — I129 Hypertensive chronic kidney disease with stage 1 through stage 4 chronic kidney disease, or unspecified chronic kidney disease: Secondary | ICD-10-CM | POA: Diagnosis not present

## 2023-09-21 DIAGNOSIS — Z794 Long term (current) use of insulin: Secondary | ICD-10-CM | POA: Diagnosis not present

## 2023-09-21 DIAGNOSIS — Z125 Encounter for screening for malignant neoplasm of prostate: Secondary | ICD-10-CM | POA: Diagnosis not present

## 2023-09-21 DIAGNOSIS — E7800 Pure hypercholesterolemia, unspecified: Secondary | ICD-10-CM | POA: Diagnosis not present

## 2023-09-27 DIAGNOSIS — G4733 Obstructive sleep apnea (adult) (pediatric): Secondary | ICD-10-CM | POA: Diagnosis not present

## 2023-09-27 DIAGNOSIS — Z794 Long term (current) use of insulin: Secondary | ICD-10-CM | POA: Diagnosis not present

## 2023-09-27 DIAGNOSIS — E1129 Type 2 diabetes mellitus with other diabetic kidney complication: Secondary | ICD-10-CM | POA: Diagnosis not present

## 2023-09-27 DIAGNOSIS — E7800 Pure hypercholesterolemia, unspecified: Secondary | ICD-10-CM | POA: Diagnosis not present

## 2023-09-27 DIAGNOSIS — Z1339 Encounter for screening examination for other mental health and behavioral disorders: Secondary | ICD-10-CM | POA: Diagnosis not present

## 2023-09-27 DIAGNOSIS — I872 Venous insufficiency (chronic) (peripheral): Secondary | ICD-10-CM | POA: Diagnosis not present

## 2023-09-27 DIAGNOSIS — I1310 Hypertensive heart and chronic kidney disease without heart failure, with stage 1 through stage 4 chronic kidney disease, or unspecified chronic kidney disease: Secondary | ICD-10-CM | POA: Diagnosis not present

## 2023-09-27 DIAGNOSIS — Z96651 Presence of right artificial knee joint: Secondary | ICD-10-CM | POA: Diagnosis not present

## 2023-09-27 DIAGNOSIS — N1831 Chronic kidney disease, stage 3a: Secondary | ICD-10-CM | POA: Diagnosis not present

## 2023-09-27 DIAGNOSIS — Z0000 Encounter for general adult medical examination without abnormal findings: Secondary | ICD-10-CM | POA: Diagnosis not present

## 2023-09-27 DIAGNOSIS — Z1331 Encounter for screening for depression: Secondary | ICD-10-CM | POA: Diagnosis not present

## 2023-09-27 DIAGNOSIS — R82998 Other abnormal findings in urine: Secondary | ICD-10-CM | POA: Diagnosis not present

## 2023-09-29 DIAGNOSIS — E1065 Type 1 diabetes mellitus with hyperglycemia: Secondary | ICD-10-CM | POA: Diagnosis not present

## 2023-10-17 ENCOUNTER — Other Ambulatory Visit (HOSPITAL_COMMUNITY): Payer: Self-pay

## 2023-10-17 MED ORDER — OZEMPIC (1 MG/DOSE) 4 MG/3ML ~~LOC~~ SOPN
1.0000 mg | PEN_INJECTOR | SUBCUTANEOUS | 3 refills | Status: AC
  Filled 2023-10-17: qty 9, 84d supply, fill #0
  Filled 2024-01-17: qty 9, 84d supply, fill #1
  Filled 2024-04-02: qty 9, 84d supply, fill #2

## 2023-10-19 DIAGNOSIS — Z85828 Personal history of other malignant neoplasm of skin: Secondary | ICD-10-CM | POA: Diagnosis not present

## 2023-10-19 DIAGNOSIS — L0889 Other specified local infections of the skin and subcutaneous tissue: Secondary | ICD-10-CM | POA: Diagnosis not present

## 2023-10-19 DIAGNOSIS — L308 Other specified dermatitis: Secondary | ICD-10-CM | POA: Diagnosis not present

## 2023-10-29 DIAGNOSIS — E1065 Type 1 diabetes mellitus with hyperglycemia: Secondary | ICD-10-CM | POA: Diagnosis not present

## 2023-11-16 DIAGNOSIS — G4733 Obstructive sleep apnea (adult) (pediatric): Secondary | ICD-10-CM | POA: Diagnosis not present

## 2023-11-23 ENCOUNTER — Other Ambulatory Visit (HOSPITAL_COMMUNITY): Payer: Self-pay

## 2023-11-29 DIAGNOSIS — E1065 Type 1 diabetes mellitus with hyperglycemia: Secondary | ICD-10-CM | POA: Diagnosis not present

## 2023-11-30 DIAGNOSIS — M19011 Primary osteoarthritis, right shoulder: Secondary | ICD-10-CM | POA: Diagnosis not present

## 2023-11-30 DIAGNOSIS — M1712 Unilateral primary osteoarthritis, left knee: Secondary | ICD-10-CM | POA: Diagnosis not present

## 2023-12-08 DIAGNOSIS — H52203 Unspecified astigmatism, bilateral: Secondary | ICD-10-CM | POA: Diagnosis not present

## 2023-12-08 DIAGNOSIS — Z961 Presence of intraocular lens: Secondary | ICD-10-CM | POA: Diagnosis not present

## 2023-12-21 DIAGNOSIS — I129 Hypertensive chronic kidney disease with stage 1 through stage 4 chronic kidney disease, or unspecified chronic kidney disease: Secondary | ICD-10-CM | POA: Diagnosis not present

## 2023-12-21 DIAGNOSIS — N1831 Chronic kidney disease, stage 3a: Secondary | ICD-10-CM | POA: Diagnosis not present

## 2023-12-21 DIAGNOSIS — Z23 Encounter for immunization: Secondary | ICD-10-CM | POA: Diagnosis not present

## 2023-12-21 DIAGNOSIS — E1129 Type 2 diabetes mellitus with other diabetic kidney complication: Secondary | ICD-10-CM | POA: Diagnosis not present

## 2023-12-21 DIAGNOSIS — Z794 Long term (current) use of insulin: Secondary | ICD-10-CM | POA: Diagnosis not present

## 2023-12-21 DIAGNOSIS — E7800 Pure hypercholesterolemia, unspecified: Secondary | ICD-10-CM | POA: Diagnosis not present

## 2023-12-21 DIAGNOSIS — G4733 Obstructive sleep apnea (adult) (pediatric): Secondary | ICD-10-CM | POA: Diagnosis not present

## 2023-12-30 DIAGNOSIS — E1065 Type 1 diabetes mellitus with hyperglycemia: Secondary | ICD-10-CM | POA: Diagnosis not present

## 2024-01-17 ENCOUNTER — Other Ambulatory Visit (HOSPITAL_COMMUNITY): Payer: Self-pay

## 2024-01-29 DIAGNOSIS — E1065 Type 1 diabetes mellitus with hyperglycemia: Secondary | ICD-10-CM | POA: Diagnosis not present

## 2024-01-30 DIAGNOSIS — M19071 Primary osteoarthritis, right ankle and foot: Secondary | ICD-10-CM | POA: Diagnosis not present

## 2024-01-30 DIAGNOSIS — M25512 Pain in left shoulder: Secondary | ICD-10-CM | POA: Diagnosis not present

## 2024-01-30 DIAGNOSIS — G8929 Other chronic pain: Secondary | ICD-10-CM | POA: Diagnosis not present

## 2024-01-30 DIAGNOSIS — M19072 Primary osteoarthritis, left ankle and foot: Secondary | ICD-10-CM | POA: Diagnosis not present

## 2024-01-30 DIAGNOSIS — R2243 Localized swelling, mass and lump, lower limb, bilateral: Secondary | ICD-10-CM | POA: Diagnosis not present

## 2024-02-21 DIAGNOSIS — L821 Other seborrheic keratosis: Secondary | ICD-10-CM | POA: Diagnosis not present

## 2024-02-21 DIAGNOSIS — D2362 Other benign neoplasm of skin of left upper limb, including shoulder: Secondary | ICD-10-CM | POA: Diagnosis not present

## 2024-02-21 DIAGNOSIS — L918 Other hypertrophic disorders of the skin: Secondary | ICD-10-CM | POA: Diagnosis not present

## 2024-02-21 DIAGNOSIS — L570 Actinic keratosis: Secondary | ICD-10-CM | POA: Diagnosis not present

## 2024-02-21 DIAGNOSIS — Z85828 Personal history of other malignant neoplasm of skin: Secondary | ICD-10-CM | POA: Diagnosis not present

## 2024-02-21 DIAGNOSIS — S80812A Abrasion, left lower leg, initial encounter: Secondary | ICD-10-CM | POA: Diagnosis not present

## 2024-02-29 DIAGNOSIS — E1065 Type 1 diabetes mellitus with hyperglycemia: Secondary | ICD-10-CM | POA: Diagnosis not present

## 2024-04-02 ENCOUNTER — Other Ambulatory Visit (HOSPITAL_COMMUNITY): Payer: Self-pay

## 2024-06-07 ENCOUNTER — Ambulatory Visit: Payer: Medicare PPO | Admitting: Adult Health
